# Patient Record
Sex: Male | Born: 1940 | Race: Black or African American | Hispanic: No | Marital: Married | Smoking: Never smoker
Health system: Southern US, Community
[De-identification: ages and names within clinical notes are randomized; demographics above are authoritative.]

## PROBLEM LIST (undated history)

## (undated) DIAGNOSIS — C801 Malignant (primary) neoplasm, unspecified: Secondary | ICD-10-CM

## (undated) DIAGNOSIS — S6990XA Unspecified injury of unspecified wrist, hand and finger(s), initial encounter: Secondary | ICD-10-CM

## (undated) DIAGNOSIS — F419 Anxiety disorder, unspecified: Secondary | ICD-10-CM

## (undated) DIAGNOSIS — K439 Ventral hernia without obstruction or gangrene: Secondary | ICD-10-CM

## (undated) DIAGNOSIS — K219 Gastro-esophageal reflux disease without esophagitis: Secondary | ICD-10-CM

## (undated) DIAGNOSIS — M1990 Unspecified osteoarthritis, unspecified site: Secondary | ICD-10-CM

## (undated) DIAGNOSIS — N400 Enlarged prostate without lower urinary tract symptoms: Secondary | ICD-10-CM

## (undated) DIAGNOSIS — I10 Essential (primary) hypertension: Secondary | ICD-10-CM

## (undated) HISTORY — PX: ROTATOR CUFF REPAIR: SHX139

## (undated) HISTORY — PX: APPENDECTOMY: SHX54

---

## 1978-05-07 HISTORY — PX: HERNIA REPAIR: SHX51

## 2005-10-16 ENCOUNTER — Encounter: Admission: RE | Admit: 2005-10-16 | Discharge: 2005-10-16 | Payer: Self-pay | Admitting: Family Medicine

## 2005-11-06 ENCOUNTER — Encounter: Admission: RE | Admit: 2005-11-06 | Discharge: 2006-02-04 | Payer: Self-pay | Admitting: Family Medicine

## 2007-05-30 ENCOUNTER — Emergency Department (HOSPITAL_COMMUNITY): Admission: EM | Admit: 2007-05-30 | Discharge: 2007-05-31 | Payer: Self-pay | Admitting: Emergency Medicine

## 2011-01-26 LAB — CBC
HCT: 35.1 — ABNORMAL LOW
Hemoglobin: 12.3 — ABNORMAL LOW
MCHC: 35
MCV: 84.9
Platelets: 219
RBC: 4.14 — ABNORMAL LOW
RDW: 13.8
WBC: 5

## 2011-01-26 LAB — BASIC METABOLIC PANEL
BUN: 20
CO2: 27
Calcium: 9
Chloride: 104
Creatinine, Ser: 1.02
GFR calc Af Amer: >60
GFR calc non Af Amer: >60
Glucose, Bld: 133 — ABNORMAL HIGH
Potassium: 4
Sodium: 137

## 2011-01-26 LAB — DIFFERENTIAL
Basophils Absolute: 0
Basophils Relative: 0
Eosinophils Absolute: 0.1
Eosinophils Relative: 2
Lymphocytes Relative: 38
Lymphs Abs: 1.9
Monocytes Absolute: 0.3
Monocytes Relative: 6
Neutro Abs: 2.7
Neutrophils Relative %: 54

## 2011-01-26 LAB — URINALYSIS, ROUTINE W REFLEX MICROSCOPIC
Glucose, UA: NEGATIVE
Ketones, ur: 15 — AB
Nitrite: POSITIVE — AB
Protein, ur: 100 — AB
Specific Gravity, Urine: 1.034 — ABNORMAL HIGH
Urobilinogen, UA: 1
pH: 6.5

## 2011-01-26 LAB — URINE MICROSCOPIC-ADD ON

## 2011-01-26 LAB — URINE CULTURE: Colony Count: 1000

## 2011-03-19 ENCOUNTER — Encounter: Payer: Self-pay | Admitting: *Deleted

## 2011-03-19 ENCOUNTER — Emergency Department (HOSPITAL_COMMUNITY): Payer: Medicare Other

## 2011-03-19 ENCOUNTER — Emergency Department (HOSPITAL_COMMUNITY)
Admission: EM | Admit: 2011-03-19 | Discharge: 2011-03-19 | Disposition: A | Discharge status: Home / Self Care | Payer: Medicare Other | Attending: Emergency Medicine | Admitting: Emergency Medicine

## 2011-03-19 DIAGNOSIS — IMO0002 Reserved for concepts with insufficient information to code with codable children: Secondary | ICD-10-CM | POA: Insufficient documentation

## 2011-03-19 DIAGNOSIS — I10 Essential (primary) hypertension: Secondary | ICD-10-CM | POA: Insufficient documentation

## 2011-03-19 DIAGNOSIS — W010XXA Fall on same level from slipping, tripping and stumbling without subsequent striking against object, initial encounter: Secondary | ICD-10-CM | POA: Insufficient documentation

## 2011-03-19 DIAGNOSIS — S8390XA Sprain of unspecified site of unspecified knee, initial encounter: Secondary | ICD-10-CM

## 2011-03-19 DIAGNOSIS — M79609 Pain in unspecified limb: Secondary | ICD-10-CM | POA: Insufficient documentation

## 2011-03-19 DIAGNOSIS — E119 Type 2 diabetes mellitus without complications: Secondary | ICD-10-CM | POA: Insufficient documentation

## 2011-03-19 DIAGNOSIS — Z79899 Other long term (current) drug therapy: Secondary | ICD-10-CM | POA: Insufficient documentation

## 2011-03-19 DIAGNOSIS — M7989 Other specified soft tissue disorders: Secondary | ICD-10-CM | POA: Insufficient documentation

## 2011-03-19 DIAGNOSIS — M25569 Pain in unspecified knee: Secondary | ICD-10-CM | POA: Insufficient documentation

## 2011-03-19 HISTORY — DX: Anxiety disorder, unspecified: F41.9

## 2011-03-19 HISTORY — DX: Benign prostatic hyperplasia without lower urinary tract symptoms: N40.0

## 2011-03-19 HISTORY — DX: Essential (primary) hypertension: I10

## 2011-03-19 MED ORDER — HYDROCODONE-ACETAMINOPHEN 5-500 MG PO TABS: 1.0000 | ORAL_TABLET | Freq: Four times a day (QID) | ORAL | Status: AC | PRN

## 2011-03-19 MED ORDER — HYDROCODONE-ACETAMINOPHEN 5-325 MG PO TABS: 1.0000 | ORAL_TABLET | Freq: Once | ORAL | Status: DC

## 2011-03-19 NOTE — ED Notes (Signed)
Pt slipped and fell around 2230, sliped in dog "poop",  c/o L thigh pain & knee pain, pinpoints most pain to mid thigh, describes as severe.

## 2011-03-19 NOTE — ED Notes (Signed)
Pt states that he tripped over his dogs and did a "split" pt feels like he hyper extended his knee. Pt CNS intact

## 2011-03-19 NOTE — ED Provider Notes (Signed)
History     CSN: [id number] Arrival date & time: 03/19/2011  1:32 AM   First MD Initiated Contact with Patient 03/19/11 0340      Chief Complaint  Patient presents with  . Leg Pain    after slipping and falling    (Consider location/radiation/quality/duration/timing/severity/associated sxs/prior treatment) Patient is a 70 y.o. male presenting with leg pain. The history is provided by the patient.  Leg Pain  The incident occurred 3 to 5 hours ago. The incident occurred at home. Injury mechanism: Twisting injury when going to feed his dog slipped  and injured his left distal thigh and knee. States he heard a pop able to walk on this and drive himself to the emergency department. Pain location: Left thigh and knee. The pain is moderate. The pain has been constant since onset. Pertinent negatives include no numbness, no inability to bear weight, no loss of motion, no muscle weakness, no loss of sensation and no tingling. The symptoms are aggravated by activity, bearing weight and palpation. He has tried nothing for the symptoms.   Pain is located left distal thigh and just above his patella with associated swelling. Sharp in nature. Moderate in severity. Constant since onset. No alleviating factors. No radiation of pain. No fall or injury otherwise.   Past Medical History  Diagnosis Date  . Hypertension   . Diabetes mellitus   . Anxiety   . Enlarged prostate     History reviewed. No pertinent past surgical history.  Family History  Problem Relation Age of Onset  . Diabetes Father     History  Substance Use Topics  . Smoking status: Never Smoker   . Smokeless tobacco: Not on file  . Alcohol Use: No      Review of Systems  Constitutional: Negative for fever and chills.  HENT: Negative for neck pain and neck stiffness.   Eyes: Negative for pain.  Respiratory: Negative for shortness of breath.   Cardiovascular: Positive for leg swelling. Negative for chest pain and  palpitations.  Gastrointestinal: Negative for abdominal pain.  Genitourinary: Negative for dysuria.  Musculoskeletal: Negative for back pain.  Skin: Negative for rash and wound.  Neurological: Negative for tingling, numbness and headaches.  All other systems reviewed and are negative.    Allergies  Review of patient's allergies indicates no known allergies.  Home Medications   Current Outpatient Rx  Name Route Sig Dispense Refill  . CITALOPRAM HYDROBROMIDE 20 MG PO TABS Oral Take 20 mg by mouth daily.      Marland Kitchen CLONAZEPAM PO Oral Take 1 mg by mouth 2 (two) times daily.      . IRON PO Oral Take 1 tablet by mouth daily.      Marland Kitchen LISINOPRIL-HYDROCHLOROTHIAZIDE 20-25 MG PO TABS Oral Take 1 tablet by mouth daily.      Marland Kitchen METFORMIN HCL ER 500 MG PO TB24 Oral Take 500 mg by mouth daily with breakfast.      . MULTIVITAMIN PO Oral Take 1 tablet by mouth daily.      Marland Kitchen TAMSULOSIN HCL 0.4 MG PO CAPS Oral Take 0.4 mg by mouth daily.        BP 137/79  Pulse 73  Temp(Src) 98.2 F (36.8 C) (Oral)  Resp 18  SpO2 96%  Physical Exam  Constitutional: He is oriented to person, place, and time. He appears well-developed and well-nourished.  HENT:  Head: Normocephalic and atraumatic.  Eyes: Conjunctivae and EOM are normal. Pupils are equal, round, and reactive  to light.  Neck: Full passive range of motion without pain. Neck supple. No thyromegaly present.       No midline cervical, thoracic or lumbar tenderness or warmth.  Cardiovascular: Normal rate, regular rhythm, S1 normal, S2 normal and intact distal pulses.   Pulmonary/Chest: Effort normal and breath sounds normal.  Abdominal: Soft. Bowel sounds are normal. There is no tenderness. There is no CVA tenderness.  Musculoskeletal: Normal range of motion.       Left lower extremity: Pain localized to distal medial aspect of thigh with overlying tenderness to palpation. There is moderate amount of swelling to this area as well as the suprapatellar  region. Patient is able to lift his leg off the bed and extend at the knee. No bony deformity. Distal neurovascular intact. Nontender over the hip and ankle. No appreciable joint instability  Neurological: He is alert and oriented to person, place, and time. He has normal strength and normal reflexes. No cranial nerve deficit or sensory deficit. He displays a negative Romberg sign. GCS eye subscore is 4. GCS verbal subscore is 5. GCS motor subscore is 6.       No lower extremity deficits with distal pulses, motor and sensorium to light touch intact.  Skin: Skin is warm and dry. No rash noted. No cyanosis. Nails show no clubbing.  Psychiatric: He has a normal mood and affect. His speech is normal and behavior is normal.    ED Course  Procedures (including critical care time)  Labs Reviewed - No data to display Dg Knee Complete 4 Views Left Show As Much Of Femur As Possible  03/19/2011  *RADIOLOGY REPORT*  Clinical Data: Status post fall; anterior and medial left knee pain.  LEFT KNEE - COMPLETE 4+ VIEW  Comparison: None.  Findings: There is no evidence of fracture or dislocation.  The joint spaces are preserved.  No significant degenerative change is seen; the patellofemoral joint is grossly unremarkable in appearance.  An enthesophyte is noted arising at the superior pole of the patella.  A small to moderate knee joint effusion is suspected.  There is significant soft tissue swelling in the suprapatellar region, raising question for soft tissue injury.  IMPRESSION:  1.  No evidence of fracture or dislocation. 2.  Small to moderate knee joint effusion suspected. 3.  Significant soft tissue swelling in the suprapatellar region, raising question for soft tissue injury.  Original Report Authenticated By: Tonia Ghent, M.D.      no narcotics provided emergency department patient is driving home .x-ray obtained and reviewed as above. Immobilizer and crutches provided. prescription for pain medications  provided    MDM   Injury mechanism with hearing a pop and now swelling raises concern for partial tear the quadriceps tendon, no complete tear by physical exam. Plan pain control, ice and elevation and close followup with orthopedics. Patient is a reliable historian in stable for discharge home.        Sunnie Nielsen, MD 03/19/11 [phone]

## 2011-03-19 NOTE — ED Notes (Signed)
Ortho paged for crutches and immobilizer 

## 2011-04-25 ENCOUNTER — Other Ambulatory Visit: Payer: Self-pay | Admitting: Family Medicine

## 2011-04-25 ENCOUNTER — Ambulatory Visit
Admission: RE | Admit: 2011-04-25 | Discharge: 2011-04-25 | Disposition: A | Discharge status: Home / Self Care | Payer: Medicare Other | Source: Ambulatory Visit | Attending: Family Medicine | Admitting: Family Medicine

## 2011-04-25 DIAGNOSIS — R52 Pain, unspecified: Secondary | ICD-10-CM

## 2011-04-25 DIAGNOSIS — R609 Edema, unspecified: Secondary | ICD-10-CM

## 2013-02-12 ENCOUNTER — Other Ambulatory Visit (HOSPITAL_COMMUNITY): Payer: Self-pay | Admitting: Sports Medicine

## 2013-02-12 DIAGNOSIS — M79652 Pain in left thigh: Secondary | ICD-10-CM

## 2013-02-20 ENCOUNTER — Encounter (HOSPITAL_COMMUNITY): Payer: Medicare Other

## 2013-03-23 ENCOUNTER — Other Ambulatory Visit: Payer: Self-pay | Admitting: Orthopedic Surgery

## 2013-03-23 DIAGNOSIS — M545 Low back pain, unspecified: Secondary | ICD-10-CM

## 2013-03-23 DIAGNOSIS — M79652 Pain in left thigh: Secondary | ICD-10-CM

## 2013-03-23 DIAGNOSIS — R1900 Intra-abdominal and pelvic swelling, mass and lump, unspecified site: Secondary | ICD-10-CM

## 2013-03-25 ENCOUNTER — Inpatient Hospital Stay
Admission: RE | Admit: 2013-03-25 | Discharge: 2013-03-25 | Disposition: A | Discharge status: Home / Self Care | Payer: Self-pay | Source: Ambulatory Visit | Attending: Orthopedic Surgery | Admitting: Orthopedic Surgery

## 2013-03-25 ENCOUNTER — Ambulatory Visit
Admission: RE | Admit: 2013-03-25 | Discharge: 2013-03-25 | Disposition: A | Discharge status: Home / Self Care | Payer: Medicare Other | Source: Ambulatory Visit | Attending: Orthopedic Surgery | Admitting: Orthopedic Surgery

## 2013-03-25 ENCOUNTER — Other Ambulatory Visit: Payer: Self-pay | Admitting: Orthopedic Surgery

## 2013-03-25 DIAGNOSIS — R52 Pain, unspecified: Secondary | ICD-10-CM

## 2013-03-25 DIAGNOSIS — M545 Low back pain, unspecified: Secondary | ICD-10-CM

## 2013-03-25 DIAGNOSIS — M79652 Pain in left thigh: Secondary | ICD-10-CM

## 2013-03-25 DIAGNOSIS — R1900 Intra-abdominal and pelvic swelling, mass and lump, unspecified site: Secondary | ICD-10-CM

## 2013-03-25 MED ORDER — GADOBENATE DIMEGLUMINE 529 MG/ML IV SOLN
18.0000 mL | Freq: Once | INTRAVENOUS | Status: AC | PRN
  Administered 2013-03-25: 18 mL via INTRAVENOUS

## 2013-03-26 ENCOUNTER — Inpatient Hospital Stay
Admission: RE | Admit: 2013-03-26 | Discharge: 2013-03-26 | Disposition: A | Discharge status: Home / Self Care | Payer: Medicare Other | Source: Ambulatory Visit | Attending: Orthopedic Surgery | Admitting: Orthopedic Surgery

## 2013-03-26 ENCOUNTER — Inpatient Hospital Stay: Admission: RE | Admit: 2013-03-26 | Payer: Medicare Other | Source: Ambulatory Visit

## 2013-03-29 ENCOUNTER — Other Ambulatory Visit: Payer: Medicare Other

## 2013-04-20 ENCOUNTER — Encounter (HOSPITAL_COMMUNITY): Payer: Self-pay | Admitting: Pharmacy Technician

## 2013-04-20 NOTE — Patient Instructions (Addendum)
20 JAXSTON CHOHAN  04/20/2013   Your procedure is scheduled on:  04/24/13  FRIDAY  Report to [facility] at   1000    AM.  Call this number if you have problems the morning of surgery: [phone]       Remember:   Do not eat food After Midnight. Thursday NIGHT--- MAY HAVE CLEAR LIQUIDS Friday MORNING UNTIL 0630am- THEN NOTHING BY MOUTH   Take these medicines the morning of surgery with A SIP OF WATER: Clonazepam, Proscar DO NOT TAKE ANY DIABETES MEDICINE MORNING OF SURGERY  .  Contacts, dentures or partial plates can not be worn to surgery  Leave suitcase in the car. After surgery it may be brought to your room.  For patients admitted to the hospital, checkout time is 11:00 AM day of  discharge.             SPECIAL INSTRUCTIONS- SEE [facility] PREPARING FOR SURGERY INSTRUCTION SHEET-     DO NOT WEAR JEWELRY, LOTIONS, POWDERS, OR PERFUMES.  WOMEN-- DO NOT SHAVE LEGS OR UNDERARMS FOR 12 HOURS BEFORE SHOWERS. MEN MAY SHAVE FACE.  Patients discharged the day of surgery will not be allowed to drive home. IF going home the day of surgery, you must have a driver and someone to stay with you for the first 24 hours  Name and phone number of your driver:  Wife  Wynona Canes                                                                      Please read over the following fact sheets that you were given: MRSA Information, Incentive Spirometry Sheet  Arron Tetrault  PST 336  [id number]                 FAILURE TO FOLLOW THESE INSTRUCTIONS MAY RESULT IN  CANCELLATION   OF YOUR SURGERY                                                  Patient Signature _____________________________

## 2013-04-20 NOTE — Progress Notes (Signed)
Dr Darrelyn Hillock-- NEED PRE OP ORDERS PLEASE-- HAS PST APPT 04/21/13 AM Thanks

## 2013-04-21 ENCOUNTER — Encounter (HOSPITAL_COMMUNITY)
Admission: RE | Admit: 2013-04-21 | Discharge: 2013-04-21 | Disposition: A | Discharge status: Home / Self Care | Payer: Medicare Other | Source: Ambulatory Visit | Attending: Orthopedic Surgery | Admitting: Orthopedic Surgery

## 2013-04-21 ENCOUNTER — Ambulatory Visit (HOSPITAL_COMMUNITY)
Admission: RE | Admit: 2013-04-21 | Discharge: 2013-04-21 | Disposition: A | Discharge status: Home / Self Care | Payer: Medicare Other | Source: Ambulatory Visit | Attending: Surgical | Admitting: Surgical

## 2013-04-21 ENCOUNTER — Encounter (HOSPITAL_COMMUNITY): Payer: Self-pay

## 2013-04-21 DIAGNOSIS — J986 Disorders of diaphragm: Secondary | ICD-10-CM | POA: Diagnosis not present

## 2013-04-21 DIAGNOSIS — M51379 Other intervertebral disc degeneration, lumbosacral region without mention of lumbar back pain or lower extremity pain: Secondary | ICD-10-CM | POA: Insufficient documentation

## 2013-04-21 DIAGNOSIS — M47817 Spondylosis without myelopathy or radiculopathy, lumbosacral region: Secondary | ICD-10-CM | POA: Diagnosis not present

## 2013-04-21 DIAGNOSIS — M47814 Spondylosis without myelopathy or radiculopathy, thoracic region: Secondary | ICD-10-CM | POA: Diagnosis not present

## 2013-04-21 DIAGNOSIS — Z01810 Encounter for preprocedural cardiovascular examination: Secondary | ICD-10-CM | POA: Insufficient documentation

## 2013-04-21 DIAGNOSIS — I10 Essential (primary) hypertension: Secondary | ICD-10-CM | POA: Diagnosis not present

## 2013-04-21 DIAGNOSIS — Z01812 Encounter for preprocedural laboratory examination: Secondary | ICD-10-CM | POA: Diagnosis not present

## 2013-04-21 DIAGNOSIS — M5137 Other intervertebral disc degeneration, lumbosacral region: Secondary | ICD-10-CM | POA: Insufficient documentation

## 2013-04-21 DIAGNOSIS — Z01818 Encounter for other preprocedural examination: Secondary | ICD-10-CM | POA: Insufficient documentation

## 2013-04-21 LAB — COMPREHENSIVE METABOLIC PANEL
ALT: 26 U/L (ref 0–53)
AST: 29 U/L (ref 0–37)
Albumin: 4.1 g/dL (ref 3.5–5.2)
Alkaline Phosphatase: 71 U/L (ref 39–117)
BUN: 12 mg/dL (ref 6–23)
CO2: 27 mEq/L (ref 19–32)
Calcium: 10 mg/dL (ref 8.4–10.5)
Chloride: 98 mEq/L (ref 96–112)
Creatinine, Ser: 0.76 mg/dL (ref 0.50–1.35)
GFR calc Af Amer: >90 mL/min (ref >90)
GFR calc non Af Amer: 89 mL/min — ABNORMAL LOW (ref >90)
Glucose, Bld: 131 mg/dL — ABNORMAL HIGH (ref 70–99)
Potassium: 4.2 mEq/L (ref 3.5–5.1)
Sodium: 136 mEq/L (ref 135–145)
Total Bilirubin: 0.2 mg/dL — ABNORMAL LOW (ref 0.3–1.2)
Total Protein: 7.7 g/dL (ref 6.0–8.3)

## 2013-04-21 LAB — CBC
HCT: 38 % — ABNORMAL LOW (ref 39.0–52.0)
Hemoglobin: 12.9 g/dL — ABNORMAL LOW (ref 13.0–17.0)
MCH: 28.9 pg (ref 26.0–34.0)
MCHC: 33.9 g/dL (ref 30.0–36.0)
MCV: 85.2 fL (ref 78.0–100.0)
Platelets: 262 10*3/uL (ref 150–400)
RBC: 4.46 MIL/uL (ref 4.22–5.81)
RDW: 15.3 % (ref 11.5–15.5)
WBC: 6.9 10*3/uL (ref 4.0–10.5)

## 2013-04-21 LAB — PROTIME-INR
INR: 1.02 (ref 0.00–1.49)
Prothrombin Time: 13.2 seconds (ref 11.6–15.2)

## 2013-04-21 LAB — URINALYSIS, ROUTINE W REFLEX MICROSCOPIC
Bilirubin Urine: NEGATIVE
Glucose, UA: 250 mg/dL — AB
Hgb urine dipstick: NEGATIVE
Ketones, ur: NEGATIVE mg/dL
Leukocytes, UA: NEGATIVE
Nitrite: NEGATIVE
Protein, ur: NEGATIVE mg/dL
Specific Gravity, Urine: 1.015 (ref 1.005–1.030)
Urobilinogen, UA: 0.2 mg/dL (ref 0.0–1.0)
pH: 5.5 (ref 5.0–8.0)

## 2013-04-21 LAB — SURGICAL PCR SCREEN
MRSA, PCR: NEGATIVE
Staphylococcus aureus: NEGATIVE

## 2013-04-21 LAB — APTT: aPTT: 27 seconds (ref 24–37)

## 2013-04-21 NOTE — Progress Notes (Signed)
Faxed urinalysis to Dr Darrelyn Hillock through [facility]

## 2013-04-21 NOTE — H&P (Signed)
Peter Summers is an 72 y.o. male.   Chief Complaint: low back pain HPI: Peter Summers presented to Dr. Markus Jarvis office with the chief complaint of low back pain. Symptoms reported today include: pain, aching, throbbing, stiffness, pain with weightbearing, difficulty ambulating, difficulty arising from chair, burning, leg pain, pain with lying, pain with lifting, pain with sitting and pain with standing. He has been dealing with these symptoms for about 7 months. Peter Summers presented to Dr. Darrelyn Hillock with severe pain in the left gluteal region radiating around the left thigh to the left knee. This has been present since July. He was seen by Dr. Penni Bombard and he had an MRI of his lumbar spine and an MRI of his left thigh. The left thigh MRI was not impressive. The low back, there were questionable issues at L3-4 on the left. Dr. Ethelene Hal did a selective nerve root block with little relief.  Past Medical History  Diagnosis Date  . Hypertension   . Diabetes mellitus   . Anxiety   . Enlarged prostate     Past Surgical History  Procedure Laterality Date  . Hernia repair  1980    hiatal hernia  . Appendectomy    . Rotator cuff repair Bilateral     Family History  Problem Relation Age of Onset  . Diabetes Father    Social History:  reports that he has never smoked. He has never used smokeless tobacco. He reports that he uses illicit drugs. He reports that he does not drink alcohol.  Allergies: No Known Allergies  Current outpatient prescriptions: clonazePAM (KLONOPIN) 1 MG tablet, Take 1 mg by mouth 2 (two) times daily., Disp: , Rfl: ;   ferrous fumarate (HEMOCYTE - 106 MG FE) 325 (106 FE) MG TABS tablet, Take 1 tablet by mouth daily., Disp: , Rfl: ;   finasteride (PROSCAR) 5 MG tablet, Take 5 mg by mouth daily., Disp: , Rfl: ;   lisinopril-hydrochlorothiazide (PRINZIDE,ZESTORETIC) 20-12.5 MG per tablet, Take 1 tablet by mouth every morning., Disp: , Rfl:  metFORMIN (GLUCOPHAGE-XR) 500 MG 24 hr  tablet, Take 500 mg by mouth daily with breakfast. , Disp: , Rfl: ;   Multiple Vitamin (MULTIVITAMIN WITH MINERALS) TABS tablet, Take 1 tablet by mouth daily., Disp: , Rfl:   Results for orders placed during the hospital encounter of 04/21/13 (from the past 48 hour(s))  URINALYSIS, ROUTINE W REFLEX MICROSCOPIC     Status: Abnormal   Collection Time    04/21/13  9:14 AM      Result Value Range   Color, Urine YELLOW  YELLOW   APPearance CLEAR  CLEAR   Specific Gravity, Urine 1.015  1.005 - 1.030   pH 5.5  5.0 - 8.0   Glucose, UA 250 (*) NEGATIVE mg/dL   Hgb urine dipstick NEGATIVE  NEGATIVE   Bilirubin Urine NEGATIVE  NEGATIVE   Ketones, ur NEGATIVE  NEGATIVE mg/dL   Protein, ur NEGATIVE  NEGATIVE mg/dL   Urobilinogen, UA 0.2  0.0 - 1.0 mg/dL   Nitrite NEGATIVE  NEGATIVE   Leukocytes, UA NEGATIVE  NEGATIVE   Comment: MICROSCOPIC NOT DONE ON URINES WITH NEGATIVE PROTEIN, BLOOD, LEUKOCYTES, NITRITE, OR GLUCOSE <1000 mg/dL.  SURGICAL PCR SCREEN     Status: None   Collection Time    04/21/13  9:15 AM      Result Value Range   MRSA, PCR NEGATIVE  NEGATIVE   Staphylococcus aureus NEGATIVE  NEGATIVE   Comment:  The Xpert SA Assay (FDA     approved for NASAL specimens     in patients over 74 years of age),     is one component of     a comprehensive surveillance     program.  Test performance has     been validated by [employer] for patients greater     than or equal to 27 year old.     It is not intended     to diagnose infection nor to     guide or monitor treatment.  APTT     Status: None   Collection Time    04/21/13 10:10 AM      Result Value Range   aPTT 27  24 - 37 seconds  COMPREHENSIVE METABOLIC PANEL     Status: Abnormal   Collection Time    04/21/13 10:10 AM      Result Value Range   Sodium 136  135 - 145 mEq/L   Potassium 4.2  3.5 - 5.1 mEq/L   Comment: SLIGHT HEMOLYSIS     HEMOLYSIS AT THIS LEVEL MAY AFFECT RESULT   Chloride 98  96 - 112 mEq/L    CO2 27  19 - 32 mEq/L   Glucose, Bld 131 (*) 70 - 99 mg/dL   BUN 12  6 - 23 mg/dL   Creatinine, Ser 1.61  0.50 - 1.35 mg/dL   Calcium 09.6  8.4 - 04.5 mg/dL   Total Protein 7.7  6.0 - 8.3 g/dL   Albumin 4.1  3.5 - 5.2 g/dL   AST 29  0 - 37 U/L   Comment: SLIGHT HEMOLYSIS     HEMOLYSIS AT THIS LEVEL MAY AFFECT RESULT   ALT 26  0 - 53 U/L   Comment: SLIGHT HEMOLYSIS     HEMOLYSIS AT THIS LEVEL MAY AFFECT RESULT   Alkaline Phosphatase 71  39 - 117 U/L   Total Bilirubin 0.2 (*) 0.3 - 1.2 mg/dL   GFR calc non Af Amer 89 (*) >90 mL/min   GFR calc Af Amer >90  >90 mL/min   Comment: (NOTE)     The eGFR has been calculated using the CKD EPI equation.     This calculation has not been validated in all clinical situations.     eGFR's persistently <90 mL/min signify possible Chronic Kidney     Disease.  PROTIME-INR     Status: None   Collection Time    04/21/13 10:10 AM      Result Value Range   Prothrombin Time 13.2  11.6 - 15.2 seconds   INR 1.02  0.00 - 1.49  CBC     Status: Abnormal   Collection Time    04/21/13 10:10 AM      Result Value Range   WBC 6.9  4.0 - 10.5 K/uL   RBC 4.46  4.22 - 5.81 MIL/uL   Hemoglobin 12.9 (*) 13.0 - 17.0 g/dL   HCT 40.9 (*) 81.1 - 91.4 %   MCV 85.2  78.0 - 100.0 fL   MCH 28.9  26.0 - 34.0 pg   MCHC 33.9  30.0 - 36.0 g/dL   RDW 78.2  95.6 - 21.3 %   Platelets 262  150 - 400 K/uL   Dg Chest 2 View  04/21/2013   CLINICAL DATA:  Hypertension.  EXAM: CHEST  2 VIEW  COMPARISON:  None.  FINDINGS: Mediastinum and hilar structures are normal. Mild subsegmental atelectasis left  lung base noted. There is mild elevation left hemidiaphragm. Lungs are otherwise clear. No pleural effusion or pneumothorax. The heart size is normal. Degenerative changes thoracic spine and both shoulders.  IMPRESSION: Mild elevation left hemidiaphragm, otherwise negative chest.   Electronically Signed   By: Maisie Fus  Register   On: 04/21/2013 11:57   Dg Lumbar Spine 2-3  Views  04/21/2013   CLINICAL DATA:  Preop lumbar surgery. Surgeon requests spine labeling.  EXAM: LUMBAR SPINE - 2-3 VIEW  COMPARISON:  Outside MRI 03/02/2013.  FINDINGS: 5 lumbar type vertebral bodies are assumed. There is disc space narrowing at L4-5 most notably. 3 mm of facet mediated anterolisthesis is present at L3-L4 with slight disc space narrowing. No worrisome osseous lesions.  IMPRESSION: Lumbar spondylosis as described.   Electronically Signed   By: Davonna Belling M.D.   On: 04/21/2013 11:35    Review of Systems  Constitutional: Negative.   HENT: Negative.   Eyes: Negative.   Respiratory: Negative.   Cardiovascular: Negative.   Gastrointestinal: Negative.   Genitourinary: Negative.   Musculoskeletal: Positive for back pain. Negative for falls, joint pain, myalgias and neck pain.  Skin: Negative.   Neurological: Positive for tingling. Negative for dizziness, tremors, sensory change, speech change, focal weakness, seizures and loss of consciousness.  Psychiatric/Behavioral: Negative.     Physical Exam  Constitutional: He is oriented to person, place, and time. He appears well-developed and well-nourished. No distress.  HENT:  Head: Normocephalic and atraumatic.  Right Ear: External ear normal.  Left Ear: External ear normal.  Nose: Nose normal.  Mouth/Throat: Oropharynx is clear and moist.  Eyes: Conjunctivae and EOM are normal.  Neck: Normal range of motion. Neck supple.  Cardiovascular: Normal rate, regular rhythm, normal heart sounds and intact distal pulses.   No murmur heard. Respiratory: Effort normal and breath sounds normal. No respiratory distress. He has no wheezes.  GI: Soft. Bowel sounds are normal. He exhibits no distension. There is no tenderness.  Musculoskeletal:       Right hip: Normal.       Left hip: Normal.       Right knee: Normal.       Left knee: Normal.       Lumbar back: He exhibits tenderness, pain and spasm.       Right lower leg: He exhibits  no tenderness and no swelling.       Left lower leg: He exhibits no tenderness and no swelling.  He is walking with an antalgic gait using one crutch. Examination of the lumbar spine shows a little tenderness in the lower lumbar segments. No true SI joint based pain. Passively her hip ROM is mildly uncomfortable but it is more buttock than anything else. Mildly positive SLR on the left for increasing back and lateral thigh pain.  Neurological: He is alert and oriented to person, place, and time. He has normal strength and normal reflexes. No sensory deficit.  Skin: No rash noted. He is not diaphoretic. No erythema.  Psychiatric: He has a normal mood and affect. His behavior is normal.     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 We do use antibiotics prior to the surgery and during surgery and after surgery. Number two is always a slight degree of probability that you could develop a blood  clot in your leg after any type of surgery and we try our best to prevent that with aspirin Summers op when it is safe to begin. The third is a dural leak. That is the spinal fluid leak that could occur. At certain rare times the bone or the disc could literally stick to the dura which is the lining which contains the spinal fluid and we could develop a small tear in that lining which we then patch up. That is an extremely rare complication. The last and final complication is a recurrent disc rupture. That means that you could rupture another small piece of disc later on down the road and there is about a 2% chance of that.  Madison Albea LAUREN 04/21/2013, 3:36 PM

## 2013-04-21 NOTE — Progress Notes (Signed)
04/21/13 0939  OBSTRUCTIVE SLEEP APNEA  Have you ever been diagnosed with sleep apnea through a sleep study? No  Do you snore loudly (loud enough to be heard through closed doors)?  1  Do you often feel tired, fatigued, or sleepy during the daytime? 0  Has anyone observed you stop breathing during your sleep? 0  Do you have, or are you being treated for high blood pressure? 1  BMI more than 35 kg/m2? 0  Age over 72 years old? 1  Neck circumference greater than 40 cm/18 inches? 0  Gender: 1  Obstructive Sleep Apnea Score 4  Score 4 or greater  Results sent to PCP

## 2013-04-24 ENCOUNTER — Observation Stay (HOSPITAL_COMMUNITY)
Admission: RE | Admit: 2013-04-24 | Discharge: 2013-04-26 | Disposition: A | Discharge status: Home / Self Care | Payer: Medicare Other | Source: Ambulatory Visit | Attending: Orthopedic Surgery | Admitting: Orthopedic Surgery

## 2013-04-24 ENCOUNTER — Encounter (HOSPITAL_COMMUNITY): Payer: Self-pay | Admitting: *Deleted

## 2013-04-24 ENCOUNTER — Ambulatory Visit (HOSPITAL_COMMUNITY): Payer: Medicare Other

## 2013-04-24 ENCOUNTER — Encounter (HOSPITAL_COMMUNITY)
Admission: RE | Disposition: A | Discharge status: Home / Self Care | Payer: Self-pay | Source: Ambulatory Visit | Attending: Orthopedic Surgery

## 2013-04-24 ENCOUNTER — Ambulatory Visit (HOSPITAL_COMMUNITY): Payer: Medicare Other | Admitting: Anesthesiology

## 2013-04-24 ENCOUNTER — Encounter (HOSPITAL_COMMUNITY): Payer: Medicare Other | Admitting: Anesthesiology

## 2013-04-24 DIAGNOSIS — M48061 Spinal stenosis, lumbar region without neurogenic claudication: Principal | ICD-10-CM | POA: Insufficient documentation

## 2013-04-24 DIAGNOSIS — M7130 Other bursal cyst, unspecified site: Secondary | ICD-10-CM | POA: Insufficient documentation

## 2013-04-24 DIAGNOSIS — M7138 Other bursal cyst, other site: Secondary | ICD-10-CM

## 2013-04-24 DIAGNOSIS — M48062 Spinal stenosis, lumbar region with neurogenic claudication: Secondary | ICD-10-CM

## 2013-04-24 DIAGNOSIS — IMO0002 Reserved for concepts with insufficient information to code with codable children: Secondary | ICD-10-CM | POA: Insufficient documentation

## 2013-04-24 HISTORY — PX: LUMBAR LAMINECTOMY/DECOMPRESSION MICRODISCECTOMY: SHX5026

## 2013-04-24 LAB — GLUCOSE, CAPILLARY
Glucose-Capillary: 118 mg/dL — ABNORMAL HIGH (ref 70–99)
Glucose-Capillary: 134 mg/dL — ABNORMAL HIGH (ref 70–99)
Glucose-Capillary: 126 mg/dL — ABNORMAL HIGH (ref 70–99)

## 2013-04-24 SURGERY — LUMBAR LAMINECTOMY/DECOMPRESSION MICRODISCECTOMY
Anesthesia: General

## 2013-04-24 MED ORDER — LACTATED RINGERS IV SOLN
INTRAVENOUS | Status: DC
  Administered 2013-04-24: 23:00:00 via INTRAVENOUS

## 2013-04-24 MED ORDER — ONDANSETRON HCL 4 MG/2ML IJ SOLN
INTRAMUSCULAR | Status: DC | PRN
  Administered 2013-04-24: 4 mg via INTRAVENOUS

## 2013-04-24 MED ORDER — LISINOPRIL 20 MG PO TABS
20.0000 mg | ORAL_TABLET | Freq: Every day | ORAL | Status: DC
  Administered 2013-04-25: 10:00:00 20 mg via ORAL
  Filled 2013-04-24 (×2): qty 1

## 2013-04-24 MED ORDER — ACETAMINOPHEN 325 MG PO TABS: 650.0000 mg | ORAL_TABLET | ORAL | Status: DC | PRN

## 2013-04-24 MED ORDER — PHENYLEPHRINE HCL 10 MG/ML IJ SOLN
20.0000 mg | INTRAVENOUS | Status: DC | PRN
  Administered 2013-04-24: 15 ug/min via INTRAVENOUS

## 2013-04-24 MED ORDER — SODIUM CHLORIDE 0.9 % IJ SOLN
INTRAMUSCULAR | Status: AC
  Filled 2013-04-24: qty 20

## 2013-04-24 MED ORDER — THROMBIN 5000 UNITS EX SOLR
CUTANEOUS | Status: DC | PRN
  Administered 2013-04-24: 10000 [IU] via TOPICAL

## 2013-04-24 MED ORDER — SUCCINYLCHOLINE CHLORIDE 20 MG/ML IJ SOLN
INTRAMUSCULAR | Status: AC
  Filled 2013-04-24: qty 1

## 2013-04-24 MED ORDER — BACITRACIN-NEOMYCIN-POLYMYXIN 400-5-5000 EX OINT
TOPICAL_OINTMENT | CUTANEOUS | Status: AC
  Filled 2013-04-24: qty 1

## 2013-04-24 MED ORDER — FENTANYL CITRATE 0.05 MG/ML IJ SOLN
INTRAMUSCULAR | Status: AC
  Filled 2013-04-24: qty 5

## 2013-04-24 MED ORDER — METHOCARBAMOL 500 MG PO TABS: 500.0000 mg | ORAL_TABLET | Freq: Four times a day (QID) | ORAL | Status: DC | PRN

## 2013-04-24 MED ORDER — FENTANYL CITRATE 0.05 MG/ML IJ SOLN
INTRAMUSCULAR | Status: DC | PRN
  Administered 2013-04-24: 50 ug via INTRAVENOUS
  Administered 2013-04-24: 100 ug via INTRAVENOUS
  Administered 2013-04-24 (×2): 50 ug via INTRAVENOUS

## 2013-04-24 MED ORDER — OXYCODONE HCL 5 MG/5ML PO SOLN: 5.0000 mg | Freq: Once | ORAL | Status: DC | PRN

## 2013-04-24 MED ORDER — INSULIN ASPART 100 UNIT/ML ~~LOC~~ SOLN
0.0000 [IU] | Freq: Three times a day (TID) | SUBCUTANEOUS | Status: DC
  Administered 2013-04-25 – 2013-04-26 (×3): 2 [IU] via SUBCUTANEOUS

## 2013-04-24 MED ORDER — GLYCOPYRROLATE 0.2 MG/ML IJ SOLN
INTRAMUSCULAR | Status: AC
  Filled 2013-04-24: qty 4

## 2013-04-24 MED ORDER — ONDANSETRON HCL 4 MG/2ML IJ SOLN
4.0000 mg | INTRAMUSCULAR | Status: DC | PRN
  Administered 2013-04-24: 4 mg via INTRAVENOUS
  Filled 2013-04-24: qty 2

## 2013-04-24 MED ORDER — LIDOCAINE HCL (CARDIAC) 20 MG/ML IV SOLN
INTRAVENOUS | Status: AC
  Filled 2013-04-24: qty 5

## 2013-04-24 MED ORDER — HYDROCHLOROTHIAZIDE 12.5 MG PO CAPS
12.5000 mg | ORAL_CAPSULE | Freq: Every day | ORAL | Status: DC
  Administered 2013-04-25 – 2013-04-26 (×2): 12.5 mg via ORAL
  Filled 2013-04-24 (×2): qty 1

## 2013-04-24 MED ORDER — ONDANSETRON HCL 4 MG/2ML IJ SOLN
INTRAMUSCULAR | Status: AC
  Filled 2013-04-24: qty 2

## 2013-04-24 MED ORDER — POLYETHYLENE GLYCOL 3350 17 G PO PACK
17.0000 g | PACK | Freq: Every day | ORAL | Status: DC | PRN
  Administered 2013-04-25 (×2): 17 g via ORAL

## 2013-04-24 MED ORDER — BUPIVACAINE-EPINEPHRINE PF 0.5-1:200000 % IJ SOLN
INTRAMUSCULAR | Status: AC
  Filled 2013-04-24: qty 30

## 2013-04-24 MED ORDER — SUCCINYLCHOLINE CHLORIDE 20 MG/ML IJ SOLN
INTRAMUSCULAR | Status: DC | PRN
  Administered 2013-04-24: 100 mg via INTRAVENOUS

## 2013-04-24 MED ORDER — BUPIVACAINE LIPOSOME 1.3 % IJ SUSP
20.0000 mL | Freq: Once | INTRAMUSCULAR | Status: AC
  Administered 2013-04-24: 20 mL
  Filled 2013-04-24: qty 20

## 2013-04-24 MED ORDER — MEPERIDINE HCL 50 MG/ML IJ SOLN: 6.2500 mg | INTRAMUSCULAR | Status: DC | PRN

## 2013-04-24 MED ORDER — EPHEDRINE SULFATE 50 MG/ML IJ SOLN
INTRAMUSCULAR | Status: DC | PRN
  Administered 2013-04-24 (×2): 5 mg via INTRAVENOUS

## 2013-04-24 MED ORDER — FLEET ENEMA 7-19 GM/118ML RE ENEM: 1.0000 | ENEMA | Freq: Once | RECTAL | Status: AC | PRN

## 2013-04-24 MED ORDER — PROPOFOL 10 MG/ML IV BOLUS
INTRAVENOUS | Status: AC
  Filled 2013-04-24: qty 20

## 2013-04-24 MED ORDER — LIDOCAINE HCL (CARDIAC) 20 MG/ML IV SOLN
INTRAVENOUS | Status: DC | PRN
  Administered 2013-04-24: 80 mg via INTRAVENOUS

## 2013-04-24 MED ORDER — PHENYLEPHRINE HCL 10 MG/ML IJ SOLN
INTRAMUSCULAR | Status: DC | PRN
  Administered 2013-04-24 (×4): 40 ug via INTRAVENOUS

## 2013-04-24 MED ORDER — OXYCODONE HCL 5 MG PO TABS: 5.0000 mg | ORAL_TABLET | Freq: Once | ORAL | Status: DC | PRN

## 2013-04-24 MED ORDER — PROPOFOL 10 MG/ML IV BOLUS
INTRAVENOUS | Status: DC | PRN
  Administered 2013-04-24: 180 mg via INTRAVENOUS

## 2013-04-24 MED ORDER — BISACODYL 10 MG RE SUPP: 10.0000 mg | Freq: Every day | RECTAL | Status: DC | PRN

## 2013-04-24 MED ORDER — CEFAZOLIN SODIUM 1-5 GM-% IV SOLN
1.0000 g | Freq: Three times a day (TID) | INTRAVENOUS | Status: AC
  Administered 2013-04-24 – 2013-04-25 (×3): 1 g via INTRAVENOUS
  Filled 2013-04-24 (×4): qty 50

## 2013-04-24 MED ORDER — ROCURONIUM BROMIDE 100 MG/10ML IV SOLN
INTRAVENOUS | Status: DC | PRN
  Administered 2013-04-24: 40 mg via INTRAVENOUS
  Administered 2013-04-24: 5 mg via INTRAVENOUS

## 2013-04-24 MED ORDER — PHENYLEPHRINE 40 MCG/ML (10ML) SYRINGE FOR IV PUSH (FOR BLOOD PRESSURE SUPPORT)
PREFILLED_SYRINGE | INTRAVENOUS | Status: AC
  Filled 2013-04-24: qty 10

## 2013-04-24 MED ORDER — OXYCODONE-ACETAMINOPHEN 5-325 MG PO TABS: 1.0000 | ORAL_TABLET | ORAL | Status: DC | PRN

## 2013-04-24 MED ORDER — CLONAZEPAM 1 MG PO TABS
1.0000 mg | ORAL_TABLET | Freq: Two times a day (BID) | ORAL | Status: DC
  Administered 2013-04-24 – 2013-04-26 (×4): 1 mg via ORAL
  Filled 2013-04-24 (×4): qty 1

## 2013-04-24 MED ORDER — GLYCOPYRROLATE 0.2 MG/ML IJ SOLN
INTRAMUSCULAR | Status: DC | PRN
  Administered 2013-04-24: .8 mg via INTRAVENOUS

## 2013-04-24 MED ORDER — NEOSTIGMINE METHYLSULFATE 1 MG/ML IJ SOLN
INTRAMUSCULAR | Status: AC
  Filled 2013-04-24: qty 10

## 2013-04-24 MED ORDER — METHOCARBAMOL 100 MG/ML IJ SOLN
500.0000 mg | Freq: Four times a day (QID) | INTRAVENOUS | Status: DC | PRN
  Administered 2013-04-24: 500 mg via INTRAVENOUS
  Filled 2013-04-24: qty 5

## 2013-04-24 MED ORDER — FINASTERIDE 5 MG PO TABS
5.0000 mg | ORAL_TABLET | Freq: Every day | ORAL | Status: DC
  Administered 2013-04-24 – 2013-04-26 (×3): 5 mg via ORAL
  Filled 2013-04-24 (×3): qty 1

## 2013-04-24 MED ORDER — FERROUS FUMARATE 325 (106 FE) MG PO TABS
1.0000 | ORAL_TABLET | Freq: Every day | ORAL | Status: DC
  Administered 2013-04-24 – 2013-04-26 (×3): 106 mg via ORAL
  Filled 2013-04-24 (×4): qty 1

## 2013-04-24 MED ORDER — BUPIVACAINE LIPOSOME 1.3 % IJ SUSP
INTRAMUSCULAR | Status: DC | PRN
  Administered 2013-04-24: 20 mL

## 2013-04-24 MED ORDER — ROCURONIUM BROMIDE 100 MG/10ML IV SOLN
INTRAVENOUS | Status: AC
  Filled 2013-04-24: qty 1

## 2013-04-24 MED ORDER — PROMETHAZINE HCL 25 MG/ML IJ SOLN: 6.2500 mg | INTRAMUSCULAR | Status: DC | PRN

## 2013-04-24 MED ORDER — LISINOPRIL-HYDROCHLOROTHIAZIDE 20-12.5 MG PO TABS: 1.0000 | ORAL_TABLET | Freq: Every morning | ORAL | Status: DC

## 2013-04-24 MED ORDER — METFORMIN HCL ER 500 MG PO TB24
500.0000 mg | ORAL_TABLET | Freq: Every day | ORAL | Status: DC
  Administered 2013-04-25 – 2013-04-26 (×2): 500 mg via ORAL
  Filled 2013-04-24 (×3): qty 1

## 2013-04-24 MED ORDER — HYDROMORPHONE HCL PF 1 MG/ML IJ SOLN
INTRAMUSCULAR | Status: AC
  Administered 2013-04-24: 1 mg via INTRAVENOUS
  Filled 2013-04-24: qty 1

## 2013-04-24 MED ORDER — THROMBIN 5000 UNITS EX SOLR
CUTANEOUS | Status: AC
  Filled 2013-04-24: qty 10000

## 2013-04-24 MED ORDER — CEFAZOLIN SODIUM-DEXTROSE 2-3 GM-% IV SOLR
INTRAVENOUS | Status: AC
  Filled 2013-04-24: qty 50

## 2013-04-24 MED ORDER — LACTATED RINGERS IV SOLN
INTRAVENOUS | Status: DC
  Administered 2013-04-24: 1000 mL via INTRAVENOUS
  Administered 2013-04-24: 15:00:00 via INTRAVENOUS

## 2013-04-24 MED ORDER — METOCLOPRAMIDE HCL 10 MG PO TABS
5.0000 mg | ORAL_TABLET | Freq: Three times a day (TID) | ORAL | Status: DC | PRN
  Administered 2013-04-24: 5 mg via ORAL
  Filled 2013-04-24: qty 1

## 2013-04-24 MED ORDER — ACETAMINOPHEN 650 MG RE SUPP: 650.0000 mg | RECTAL | Status: DC | PRN

## 2013-04-24 MED ORDER — HYDROMORPHONE HCL PF 1 MG/ML IJ SOLN
0.2500 mg | INTRAMUSCULAR | Status: DC | PRN
  Administered 2013-04-24: 0.5 mg via INTRAVENOUS

## 2013-04-24 MED ORDER — MENTHOL 3 MG MT LOZG
1.0000 | LOZENGE | OROMUCOSAL | Status: DC | PRN
  Filled 2013-04-24: qty 9

## 2013-04-24 MED ORDER — HYDROCODONE-ACETAMINOPHEN 5-325 MG PO TABS
1.0000 | ORAL_TABLET | ORAL | Status: DC | PRN
  Administered 2013-04-25 – 2013-04-26 (×5): 2 via ORAL
  Filled 2013-04-24 (×5): qty 2

## 2013-04-24 MED ORDER — CEFAZOLIN SODIUM-DEXTROSE 2-3 GM-% IV SOLR
2.0000 g | INTRAVENOUS | Status: AC
  Administered 2013-04-24: 2 g via INTRAVENOUS

## 2013-04-24 MED ORDER — PHENYLEPHRINE HCL 10 MG/ML IJ SOLN
INTRAMUSCULAR | Status: AC
  Filled 2013-04-24: qty 2

## 2013-04-24 MED ORDER — SODIUM CHLORIDE 0.9 % IR SOLN
Status: DC | PRN
  Administered 2013-04-24: 13:00:00

## 2013-04-24 MED ORDER — MIDAZOLAM HCL 5 MG/5ML IJ SOLN
INTRAMUSCULAR | Status: DC | PRN
  Administered 2013-04-24 (×2): 1 mg via INTRAVENOUS

## 2013-04-24 MED ORDER — PHENOL 1.4 % MT LIQD: 1.0000 | OROMUCOSAL | Status: DC | PRN

## 2013-04-24 MED ORDER — NEOSTIGMINE METHYLSULFATE 1 MG/ML IJ SOLN
INTRAMUSCULAR | Status: DC | PRN
  Administered 2013-04-24: 5 mg via INTRAVENOUS

## 2013-04-24 MED ORDER — HYDROMORPHONE HCL PF 1 MG/ML IJ SOLN
0.5000 mg | INTRAMUSCULAR | Status: DC | PRN
  Administered 2013-04-24: 1 mg via INTRAVENOUS
  Filled 2013-04-24: qty 1

## 2013-04-24 MED ORDER — MIDAZOLAM HCL 2 MG/2ML IJ SOLN
INTRAMUSCULAR | Status: AC
  Filled 2013-04-24: qty 2

## 2013-04-24 SURGICAL SUPPLY — 43 items
BAG ZIPLOCK 12X15 (MISCELLANEOUS) ×2 IMPLANT
BENZOIN TINCTURE PRP APPL 2/3 (GAUZE/BANDAGES/DRESSINGS) ×2 IMPLANT
CLEANER TIP ELECTROSURG 2X2 (MISCELLANEOUS) ×2 IMPLANT
CONT SPECI 4OZ STER CLIK (MISCELLANEOUS) ×2 IMPLANT
DRAIN PENROSE 18X1/4 LTX STRL (WOUND CARE) IMPLANT
DRAPE MICROSCOPE LEICA (MISCELLANEOUS) ×2 IMPLANT
DRAPE POUCH INSTRU U-SHP 10X18 (DRAPES) ×2 IMPLANT
DRAPE SURG 17X11 SM STRL (DRAPES) ×2 IMPLANT
DRSG ADAPTIC 3X8 NADH LF (GAUZE/BANDAGES/DRESSINGS) ×2 IMPLANT
DRSG EMULSION OIL 3X3 NADH (GAUZE/BANDAGES/DRESSINGS) ×2 IMPLANT
DRSG PAD ABDOMINAL 8X10 ST (GAUZE/BANDAGES/DRESSINGS) ×2 IMPLANT
DURAPREP 26ML APPLICATOR (WOUND CARE) ×2 IMPLANT
ELECT BLADE TIP CTD 4 INCH (ELECTRODE) ×2 IMPLANT
ELECT REM PT RETURN 9FT ADLT (ELECTROSURGICAL) ×2
ELECTRODE REM PT RTRN 9FT ADLT (ELECTROSURGICAL) ×1 IMPLANT
GLOVE BIOGEL PI IND STRL 8 (GLOVE) ×2 IMPLANT
GLOVE BIOGEL PI INDICATOR 8 (GLOVE) ×2
GLOVE ECLIPSE 8.0 STRL XLNG CF (GLOVE) ×4 IMPLANT
GOWN PREVENTION PLUS LG XLONG (DISPOSABLE) ×6 IMPLANT
GOWN STRL REIN XL XLG (GOWN DISPOSABLE) ×4 IMPLANT
KIT BASIN OR (CUSTOM PROCEDURE TRAY) ×2 IMPLANT
KIT POSITIONING SURG ANDREWS (MISCELLANEOUS) ×2 IMPLANT
MANIFOLD NEPTUNE II (INSTRUMENTS) ×2 IMPLANT
NEEDLE SPNL 18GX3.5 QUINCKE PK (NEEDLE) ×4 IMPLANT
NS IRRIG 1000ML POUR BTL (IV SOLUTION) ×2 IMPLANT
PAD ABD 8X10 STRL (GAUZE/BANDAGES/DRESSINGS) ×2 IMPLANT
PATTIES SURGICAL .5 X.5 (GAUZE/BANDAGES/DRESSINGS) IMPLANT
PATTIES SURGICAL .75X.75 (GAUZE/BANDAGES/DRESSINGS) IMPLANT
PATTIES SURGICAL 1X1 (DISPOSABLE) IMPLANT
PIN SAFETY NICK PLATE  2 MED (MISCELLANEOUS)
PIN SAFETY NICK PLATE 2 MED (MISCELLANEOUS) IMPLANT
POSITIONER SURGICAL ARM (MISCELLANEOUS) ×2 IMPLANT
SPONGE LAP 4X18 X RAY DECT (DISPOSABLE) ×2 IMPLANT
SPONGE SURGIFOAM ABS GEL 100 (HEMOSTASIS) ×2 IMPLANT
STAPLER VISISTAT 35W (STAPLE) IMPLANT
SUT VIC AB 0 CT1 27 (SUTURE) ×1
SUT VIC AB 0 CT1 27XBRD ANTBC (SUTURE) ×1 IMPLANT
SUT VIC AB 1 CT1 27 (SUTURE) ×4
SUT VIC AB 1 CT1 27XBRD ANTBC (SUTURE) ×4 IMPLANT
TAPE CLOTH SURG 6X10 WHT LF (GAUZE/BANDAGES/DRESSINGS) ×2 IMPLANT
TOWEL OR 17X26 10 PK STRL BLUE (TOWEL DISPOSABLE) ×4 IMPLANT
TRAY LAMINECTOMY (CUSTOM PROCEDURE TRAY) ×2 IMPLANT
WATER STERILE IRR 1500ML POUR (IV SOLUTION) ×2 IMPLANT

## 2013-04-24 NOTE — Anesthesia Preprocedure Evaluation (Addendum)
Anesthesia Evaluation  Patient identified by MRN, date of birth, ID band Patient awake    Reviewed: Allergy & Precautions, H&P , NPO status , Patient's Chart, lab work & pertinent test results  History of Anesthesia Complications (+) AWARENESS UNDER ANESTHESIA  Airway       Dental  (+) Dental Advisory Given   Pulmonary neg pulmonary ROS,          Cardiovascular hypertension, Pt. on medications     Neuro/Psych PSYCHIATRIC DISORDERS Anxiety negative neurological ROS     GI/Hepatic negative GI ROS, Neg liver ROS,   Endo/Other  diabetes, Type 2, Oral Hypoglycemic Agents  Renal/GU negative Renal ROS     Musculoskeletal negative musculoskeletal ROS (+)   Abdominal   Peds  Hematology negative hematology ROS (+)   Anesthesia Other Findings   Reproductive/Obstetrics                          Anesthesia Physical Anesthesia Plan  ASA: II  Anesthesia Plan: General   Post-op Pain Management:    Induction: Intravenous  Airway Management Planned: Oral ETT  Additional Equipment:   Intra-op Plan:   Post-operative Plan: Extubation in OR  Informed Consent: I have reviewed the patients History and Physical, chart, labs and discussed the procedure including the risks, benefits and alternatives for the proposed anesthesia with the patient or authorized representative who has indicated his/her understanding and acceptance.   Dental advisory given  Plan Discussed with: CRNA  Anesthesia Plan Comments:         Anesthesia Quick Evaluation

## 2013-04-24 NOTE — Transfer of Care (Signed)
Immediate Anesthesia Transfer of Care Note  Patient: Peter Summers  Procedure(s) Performed: Procedure(s): CENTRAL DECOMPRESSIVE LUMBAR LAMINECTOMY L3-4 EXCISION 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  (N/A)  Patient Location: PACU  Anesthesia Type:General  Level of Consciousness: awake, sedated and patient cooperative  Airway & Oxygen Therapy: Patient Spontanous Breathing and Patient connected to face mask oxygen  Post-op Assessment: Report given to PACU RN and Post -op Vital signs reviewed and stable  Post vital signs: Reviewed and stable  Complications: No apparent anesthesia complications

## 2013-04-24 NOTE — Anesthesia Postprocedure Evaluation (Signed)
Anesthesia Post Note  Patient: Peter Summers  Procedure(s) Performed: Procedure(s) (LRB): CENTRAL DECOMPRESSIVE LUMBAR LAMINECTOMY L3-4 EXCISION 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  (N/A)  Anesthesia type: General  Patient location: PACU  Post pain: Pain level controlled  Post assessment: Post-op Vital signs reviewed  Last Vitals: BP 119/77  Pulse 101  Temp(Src) 36.4 C (Oral)  Resp 14  Ht 6\' 1"  (1.854 m)  Wt 186 lb (84.369 kg)  BMI 24.55 kg/m2  SpO2 100%  Post vital signs: Reviewed  Level of consciousness: sedated  Complications: No apparent anesthesia complications

## 2013-04-24 NOTE — Interval H&P Note (Signed)
History and Physical Interval Note:  04/24/2013 12:45 PM  Peter Summers  has presented today for surgery, with the diagnosis of SPINAL STENOSIS  The various methods of treatment have been discussed with the patient and family. After consideration of risks, benefits and other options for treatment, the patient has consented to  Procedure(s): CENTRAL DECOMPRESSIVE LUMBAR LAMINECTOMY L3-4 EXCISION 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  (N/A) as a surgical intervention .  The patient's history has been reviewed, patient examined, no change in status, stable for surgery.  I have reviewed the patient's chart and labs.  Questions were answered to the patient's satisfaction.     Cheikh Bramble A

## 2013-04-24 NOTE — Preoperative (Signed)
Beta Blockers   Reason not to administer Beta Blockers:Not Applicable 

## 2013-04-24 NOTE — Brief Op Note (Signed)
04/24/2013  3:30 PM  PATIENT:  Peter Summers  72 y.o. male  PRE-OPERATIVE DIAGNOSIS:  SPINAL STENOSISL-3-L-4 and L-4-L-5;Large Facet Synovial Cyst L-3-L-4 on the Left  POST-OPERATIVE DIAGNOSIS:  Same as Pre-Op  PROCEDURE:  Procedure(s): CENTRAL DECOMPRESSIVE LUMBAR LAMINECTOMY L3-4 EXCISION 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  (N/A) and Decompression of L-4-L-5 for Spinal Stenosis,TWO Level Decompression.Very Complex case.  SURGEON:  Surgeon(s) and Role:    * Jacki Cones, MD - Primary    * Drucilla Schmidt, MD - Assisting     ASSISTANTS:James Aplington MD   ANESTHESIA:   general  EBL:  Total I/O In: 1000 [I.V.:1000] Out: -   BLOOD ADMINISTERED:none  DRAINS: none   LOCAL MEDICATIONS USED:  MARCAINE20cc of 0.50% with Epinephrine at the beginning of the case and 20cc of Exparel with 20cc of Normal Saline,of which I used only  half of this mixture at end of the case.    SPECIMEN:  Source of Specimen:  L-3-L-4 Facet on the Left.  DISPOSITION OF SPECIMEN:  PATHOLOGY  COUNTS:  YES  TOURNIQUET:  * No tourniquets in log *  DICTATION: .Other Dictation: Dictation Number [id number]  PLAN OF CARE: Admit for overnight observation  PATIENT DISPOSITION:  PACU - hemodynamically stable.   Delay start of Pharmacological VTE agent (>24hrs) due to surgical blood loss or risk of bleeding: yes

## 2013-04-25 LAB — GLUCOSE, CAPILLARY
Glucose-Capillary: 129 mg/dL — ABNORMAL HIGH (ref 70–99)
Glucose-Capillary: 137 mg/dL — ABNORMAL HIGH (ref 70–99)
Glucose-Capillary: 138 mg/dL — ABNORMAL HIGH (ref 70–99)
Glucose-Capillary: 144 mg/dL — ABNORMAL HIGH (ref 70–99)
Glucose-Capillary: 99 mg/dL (ref 70–99)

## 2013-04-25 NOTE — Evaluation (Signed)
Physical Therapy Evaluation Patient Details Name: Peter Summers MRN: [id number] DOB: August 24, 1940 Today's Date: 04/25/2013 Time: 8657-8469 PT Time Calculation (min): 30 min  PT Assessment / Plan / Recommendation History of Present Illness  CENTRAL DECOMPRESSIVE LUMBAR LAMINECTOMY L3-4 EXCISION 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  (N/A) and Decompression of L-4-L-5 for Spinal Stenosis,TWO Level Decompression  Clinical Impression  Pt will benefit from PT to address deficits below    PT Assessment  Patient needs continued PT services    Follow Up Recommendations  No PT follow up    Does the patient have the potential to tolerate intense rehabilitation      Barriers to Discharge        Equipment Recommendations  Rolling walker with 5" wheels    Recommendations for Other Services     Frequency 7X/week    Precautions / Restrictions Precautions Precautions: Back Precaution Booklet Issued: Yes (comment)   Pertinent Vitals/Pain Pain controlled      Mobility  Bed Mobility Bed Mobility: Rolling Left;Sit to Sidelying Left Rolling Left: 4: Min guard Sit to Sidelying Left: 4: Min guard Details for Bed Mobility Assistance: cues for technique, back precautions Transfers Transfers: Sit to Stand;Stand to Sit Sit to Stand: 4: Min guard Stand to Sit: 4: Min guard Details for Transfer Assistance: cues for back precautions, technique Ambulation/Gait Ambulation/Gait Assistance: 5: Supervision Ambulation Distance (Feet): 260 Feet Assistive device: Rolling walker Ambulation/Gait Assistance Details: cues for RW safety and for back precautions during turns Gait Pattern: Step-through pattern General Gait Details: guarded    Exercises     PT Diagnosis: Difficulty walking  PT Problem List: Decreased activity tolerance;Decreased knowledge of use of DME;Decreased knowledge of precautions PT Treatment Interventions: DME instruction;Gait training;Stair training;Patient/family education     PT  Goals(Current goals can be found in the care plan section) Acute Rehab PT Goals Patient Stated Goal: walk!! PT Goal Formulation: With patient Time For Goal Achievement: 04/26/13 Potential to Achieve Goals: Good  Visit Information  Last PT Received On: 04/25/13 Assistance Needed: +1 History of Present Illness: CENTRAL DECOMPRESSIVE LUMBAR LAMINECTOMY L3-4 EXCISION 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  (N/A) and Decompression of L-4-L-5 for Spinal Stenosis,TWO Level Decompression       Prior Functioning  Home Living Family/patient expects to be discharged to:: Private residence Living Arrangements: Spouse/significant other Type of Home: House Home Access: Stairs to enter Secretary/administrator of Steps: 2 Home Layout: One level Home Equipment: Crutches Additional Comments: wife works second shift from 2pm-10pm;  Prior Function Level of Independence: Independent Communication Communication: No difficulties    Cognition  Cognition Arousal/Alertness: Awake/alert Behavior During Therapy: WFL for tasks assessed/performed Overall Cognitive Status: Within Functional Limits for tasks assessed    Extremity/Trunk Assessment Upper Extremity Assessment Upper Extremity Assessment: Defer to OT evaluation Lower Extremity Assessment Lower Extremity Assessment: Overall WFL for tasks assessed   Balance    End of Session PT - End of Session Equipment Utilized During Treatment: Gait belt Activity Tolerance: Patient tolerated treatment well Patient left: in chair;with call bell/phone within reach  GP Functional Assessment Tool Used: clinical judgement Functional Limitation: Mobility: Walking and moving around Mobility: Walking and Moving Around Current Status [phone]): At least 1 percent but less than 20 percent impaired, limited or restricted Mobility: Walking and Moving Around Goal Status [phone]): At least 1 percent but less than 20 percent impaired, limited or restricted   [facility] 04/25/2013,  9:48 AM

## 2013-04-25 NOTE — Evaluation (Signed)
Occupational Therapy Evaluation Patient Details Name: Peter Summers MRN: [id number] DOB: 1941/03/10 Today's Date: 04/25/2013 Time: 4098-1191 OT Time Calculation (min): 29 min  OT Assessment / Plan / Recommendation History of present illness pt was admitted for L3-4 decompression and excision of L synovial cyst   Clinical Impression   Pt was admitted for the above surgery.  All education was completed.  Pt does not need any further OT at this time.    OT Assessment  Patient does not need any further OT services    Follow Up Recommendations  No OT follow up    Barriers to Discharge      Equipment Recommendations  3 in 1 bedside comode    Recommendations for Other Services    Frequency       Precautions / Restrictions Precautions Precautions: Back Precaution Booklet Issued: Yes (comment) Restrictions Weight Bearing Restrictions: No   Pertinent Vitals/Pain Back sore only; pt is excited that LLE feels better    ADL  Grooming: Set up Where Assessed - Grooming: Unsupported sitting Upper Body Bathing: Set up Where Assessed - Upper Body Bathing: Unsupported sitting Lower Body Bathing: Moderate assistance Where Assessed - Lower Body Bathing: Supported sit to stand Upper Body Dressing: Supervision/safety Where Assessed - Upper Body Dressing: Unsupported sitting Lower Body Dressing: Moderate assistance (with reacher) Where Assessed - Lower Body Dressing: Supported sit to stand Toilet Transfer: Hydrographic surveyor Method: Sit to Barista: Raised toilet seat with arms (or 3-in-1 over toilet) Toileting - Clothing Manipulation and Hygiene: Minimal assistance Where Assessed - Toileting Clothing Manipulation and Hygiene: Sit to stand from 3-in-1 or toilet Equipment Used: Reacher;Rolling walker Transfers/Ambulation Related to ADLs: ambulated to bathroom with min guard.  Educated on and demonstrated shower transfer:  pt did not feel he needed to  practice this.  He has a shower seat in his shower ADL Comments: Educated on reacher, which pt used and toilet aid.  Wife will put socks on for pt Reviewed alternative methods for adls   OT Diagnosis:    OT Problem List:   OT Treatment Interventions:     OT Goals(Current goals can be found in the care plan section) Acute Rehab OT Goals Patient Stated Goal: walk!!  Visit Information  Last OT Received On: 04/25/13 Assistance Needed: +1 History of Present Illness: pt was admitted for L3-4 decompression and excision of L synovial cyst       Prior Functioning     Home Living Family/patient expects to be discharged to:: Private residence Living Arrangements: Spouse/significant other Type of Home: House Home Access: Stairs to enter Secretary/administrator of Steps: 2 Home Layout: One level Home Equipment: Crutches Additional Comments: wife works second shift from 2pm-10pm;  Prior Function Level of Independence: Independent Communication Communication: No difficulties         Vision/Perception     Copywriter, advertising Arousal/Alertness: Awake/alert Behavior During Therapy: WFL for tasks assessed/performed Overall Cognitive Status: Within Functional Limits for tasks assessed    Extremity/Trunk Assessment Upper Extremity Assessment Upper Extremity Assessment: Overall WFL for tasks assessed Lower Extremity Assessment Lower Extremity Assessment: Overall WFL for tasks assessed     Mobility Bed Mobility Bed Mobility: Rolling Left;Sit to Sidelying Left Rolling Left: 4: Min guard Sit to Sidelying Left: 4: Min guard Details for Bed Mobility Assistance: cues for technique, back precautions Transfers Sit to Stand: 4: Min guard;From chair/3-in-1;With upper extremity assist Stand to Sit: 4: Min guard;To chair/3-in-1;With upper extremity assist Details for  Transfer Assistance: cues for hand placement     Exercise     Balance     End of Session OT - End of  Session Activity Tolerance: Patient tolerated treatment well Patient left: in chair;with call bell/phone within reach  GO Functional Assessment Tool Used: clinical observation/judgment Functional Limitation: Self care Self Care Current Status (Z6109): At least 40 percent but less than 60 percent impaired, limited or restricted Self Care Goal Status (U0454): At least 40 percent but less than 60 percent impaired, limited or restricted Self Care Discharge Status [phone]): At least 40 percent but less than 60 percent impaired, limited or restricted   [facility] 04/25/2013, 10:32 AM Marica Otter, OTR/L [phone] 04/25/2013

## 2013-04-25 NOTE — Op Note (Signed)
NAMEBURT, PIATEK NO.:  [id number]  MEDICAL RECORD NO.:  [id number]  LOCATION:  1606                         FACILITY:  [facility]  PHYSICIAN:  Georges Lynch. Onell Mcmath, M.D.DATE OF BIRTH:  09/19/1940  DATE OF PROCEDURE:  04/24/2013 DATE OF DISCHARGE:                              OPERATIVE REPORT   SURGEON:  Georges Lynch. Darrelyn Hillock, M.D.  ASSISTANT:  Marlowe Kays, M.D.  PREOPERATIVE DIAGNOSES: 1. 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 2. Spinal stenosis, L4-5, moderate. 3. 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  POSTOPERATIVE DIAGNOSES: 1. 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 2. Spinal stenosis, L4-5, moderate. 3. 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  OPERATION: 1. Complete decompressive lumbar laminectomy at L3-4. 2. Complete decompressive lumbar laminectomy at L4-5. 3. Excision of a large synovial cyst at L3-4 inferior facet on the     left. 4. Foraminotomy for the L4 root on the left.  SPECIMEN:  Sent to the lab.  COUNT:  Needle and sponge count was correct.  DESCRIPTION OF PROCEDURE:  Under general anesthesia as I mentioned and after sterile prep and draping of the patient on spinal frame, the patient had 2 g of IV Ancef.  At this time, we first did the appropriate time-out and also marked the appropriate left side of the back in the holding area because he had left leg pain only.  Two needles were placed in the back for localization purposes.  X-ray was taken.  At that time, an incision was made over the L3-4, L4-5 interspace, bleeders identified and cauterized.  I then went down and brought the microscope in and we did an excision of spinous process at the L3-4 and L4-5 levels.  We then went down and completed a complete central decompressive lumbar laminectomy at the 2 levels.  No great care was taken to protect the dura at all times.  We went  up proximally as far as we had to until we had good open space in the canal.  We then went out laterally made sure we were free in the foramina.  We then went distally until we had good decompression distally as well L4-5.  In advancing distally on the left, there was a large compressive synovial cyst at L3-4 inferior facet on the left.  We went down distal to that and proximal to that, we went out wide, did a partial facetectomy and then removed the cystic material, send it to the lab.  The root and dura now were completely free.  It was very easy to peel this off the dura without injuring the dura.  We then went out into the foramen, made sure it had a nice foraminotomy at that level as well.  Thoroughly irrigated out the canal, there was no other pathology noted.  We closed the wound layers in usual fashion over some loosely applied thrombin-soaked Gelfoam.  At this time, we left a small deep distal and proximal part of the wound open for drainage purposes. The muscle and subcu was closed as I mentioned  with #1 Vicryl suture. We did inject a half of a mixture of 20 mL of Exparel and 20 mL of normal saline into the soft tissue before closed the skin with metal staples.  Sterile Neosporin dressing was applied.  The patient left the operative room in satisfactory condition.          ______________________________ Georges Lynch Darrelyn Hillock, M.D.     RAG/MEDQ  D:  04/24/2013  T:  04/25/2013  Job:  [id number]

## 2013-04-25 NOTE — Progress Notes (Addendum)
Subjective: 1 Day Post-Op Procedure(s) (LRB): CENTRAL DECOMPRESSIVE LUMBAR LAMINECTOMY L3-4 EXCISION 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  (N/A) Patient reports pain as mild. C/o back pain, legs doing well. Has not been OOB yet.  Objective: Vital signs in last 24 hours: Temp:  [97.5 F (36.4 C)-98.2 F (36.8 C)] 98.2 F (36.8 C) (12/20 0606) Pulse Rate:  [90-112] 91 (12/20 0606) Resp:  [10-20] 14 (12/20 0606) BP: (109-172)/(68-85) 115/71 mmHg (12/20 0606) SpO2:  [98 %-100 %] 100 % (12/20 0606) Weight:  [84.369 kg (186 lb)] 84.369 kg (186 lb) (12/19 1643)  Intake/Output from previous day: 12/19 0701 - 12/20 0700 In: 2860 [P.O.:30; I.V.:2675; IV Piggyback:155] Out: 850 [Urine:850] Intake/Output this shift:    No results found for this basename: HGB,  in the last 72 hours No results found for this basename: WBC, RBC, HCT, PLT,  in the last 72 hours No results found for this basename: NA, K, CL, CO2, BUN, CREATININE, GLUCOSE, CALCIUM,  in the last 72 hours No results found for this basename: LABPT, INR,  in the last 72 hours  Neurologically intact ABD soft Neurovascular intact Sensation intact distally Intact pulses distally Dorsiflexion/Plantar flexion intact Incision: dressing C/D/I and no drainage No cellulitis present Compartment soft no calf pain or sign of DVT  Assessment/Plan: 1 Day Post-Op Procedure(s) (LRB): CENTRAL DECOMPRESSIVE LUMBAR LAMINECTOMY L3-4 EXCISION 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  (N/A) Advance diet Up with therapy D/C IV fluids Plan D/C tomorrow after PT today  Olanda Downie M. 04/25/2013, 7:37 AM

## 2013-04-25 NOTE — Progress Notes (Signed)
Utilization Review completed.  

## 2013-04-26 LAB — GLUCOSE, CAPILLARY
Glucose-Capillary: 125 mg/dL — ABNORMAL HIGH (ref 70–99)
Glucose-Capillary: 95 mg/dL (ref 70–99)

## 2013-04-26 MED ORDER — METHOCARBAMOL 500 MG PO TABS: 500.0000 mg | ORAL_TABLET | Freq: Four times a day (QID) | ORAL | Status: DC | PRN

## 2013-04-26 MED ORDER — OXYCODONE-ACETAMINOPHEN 5-325 MG PO TABS: 1.0000 | ORAL_TABLET | ORAL | Status: DC | PRN

## 2013-04-26 NOTE — Progress Notes (Signed)
Correct phone number for pt's wife, Wynona Canes, is [phone]. Peter Summers, [employer]

## 2013-04-26 NOTE — Progress Notes (Signed)
Utilization Review completed.  

## 2013-04-26 NOTE — Progress Notes (Signed)
Subjective: 2 Days Post-Op Procedure(s) (LRB): CENTRAL DECOMPRESSIVE LUMBAR LAMINECTOMY L3-4 EXCISION 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  (N/A) Patient reports pain as 1 on 0-10 scale. His leg pain is now absent. Will DC He had an Extremely large Synovial Cyst. Specimen sent to Lab.   Objective: Vital signs in last 24 hours: Temp:  [98.5 F (36.9 C)-100 F (37.8 C)] 99.7 F (37.6 C) (12/21 0545) Pulse Rate:  [93-104] 93 (12/21 0545) Resp:  [16-20] 16 (12/21 0545) BP: (100-131)/(64-82) 100/64 mmHg (12/21 0545) SpO2:  [93 %-94 %] 93 % (12/21 0545)  Intake/Output from previous day: 12/20 0701 - 12/21 0700 In: 1460 [P.O.:960; I.V.:450; IV Piggyback:50] Out: 1450 [Urine:1450] Intake/Output this shift:    No results found for this basename: HGB,  in the last 72 hours No results found for this basename: WBC, RBC, HCT, PLT,  in the last 72 hours No results found for this basename: NA, K, CL, CO2, BUN, CREATININE, GLUCOSE, CALCIUM,  in the last 72 hours No results found for this basename: LABPT, INR,  in the last 72 hours  Neurologically intact  Assessment/Plan: 2 Days Post-Op Procedure(s) (LRB): CENTRAL DECOMPRESSIVE LUMBAR LAMINECTOMY L3-4 EXCISION 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  (N/A) Discharge home with home health  Peter Summers A 04/26/2013, 8:19 AM

## 2013-04-26 NOTE — Progress Notes (Signed)
Discharged from floor via w/c, wife with pt. No changes in assessmemt. Manu Rubey, [employer]

## 2013-04-26 NOTE — Progress Notes (Signed)
Physical Therapy Treatment Patient Details Name: DOYAL SARIC MRN: [id number] DOB: 06-26-1940 Today's Date: 04/26/2013 Time: 0921-0955 PT Time Calculation (min): 34 min  PT Assessment / Plan / Recommendation  History of Present Illness pt was admitted for L3-4 decompression and excision of L synovial cyst   PT Comments   Pt doing well this am, painful but better after mobility  Follow Up Recommendations  No PT follow up;Home health PT (MD recommended HHPT, agree if ok with MD)     Does the patient have the potential to tolerate intense rehabilitation     Barriers to Discharge        Equipment Recommendations  Rolling walker with 5" wheels    Recommendations for Other Services    Frequency     Progress towards PT Goals Progress towards PT goals: Progressing toward goals  Plan Current plan remains appropriate    Precautions / Restrictions Precautions Precautions: Back Precaution Comments: reviewed back precautions Restrictions Weight Bearing Restrictions: No   Pertinent Vitals/Pain     Mobility  Bed Mobility Bed Mobility: Sit to Sidelying Left Sit to Sidelying Left: 4: Min guard Details for Bed Mobility Assistance: cues for technique, back precautions Transfers Transfers: Sit to Stand;Stand to Sit Sit to Stand: 5: Supervision Stand to Sit: 5: Supervision Details for Transfer Assistance: cues for hand placement Ambulation/Gait Ambulation/Gait Assistance: 5: Supervision Ambulation Distance (Feet): 300 Feet Assistive device: Rolling walker Ambulation/Gait Assistance Details: cues for safety with turns Gait Pattern: Step-through pattern Stairs: Yes Stairs Assistance: 4: Min assist Stairs Assistance Details (indicate cue type and reason): cues for sequence Stair Management Technique: No rails;Forwards;With walker (walker on side) Number of Stairs: 4    Exercises     PT Diagnosis:    PT Problem List:   PT Treatment Interventions:     PT Goals (current  goals can now be found in the care plan section) Acute Rehab PT Goals Patient Stated Goal: walk!! Time For Goal Achievement: 04/26/13 Potential to Achieve Goals: Good  Visit Information  Last PT Received On: 04/26/13 Assistance Needed: +1 History of Present Illness: pt was admitted for L3-4 decompression and excision of L synovial cyst    Subjective Data  Subjective: I am hurting Patient Stated Goal: walk!!   Cognition  Cognition Arousal/Alertness: Awake/alert Behavior During Therapy: WFL for tasks assessed/performed Overall Cognitive Status: Within Functional Limits for tasks assessed    Balance     End of Session PT - End of Session Equipment Utilized During Treatment: Gait belt Activity Tolerance: Patient tolerated treatment well Patient left: in bed;with call bell/phone within reach   GP Functional Assessment Tool Used: clinical judgement Functional Limitation: Mobility: Walking and moving around Mobility: Walking and Moving Around Goal Status [phone]): At least 1 percent but less than 20 percent impaired, limited or restricted Mobility: Walking and Moving Around Discharge Status [phone]): At least 1 percent but less than 20 percent impaired, limited or restricted   [facility] 04/26/2013, 10:06 AM

## 2013-04-26 NOTE — Progress Notes (Addendum)
   CARE MANAGEMENT NOTE 04/26/2013  Patient:  Peter Summers, Peter Summers   Account Number:  [id number]  Date Initiated:  04/26/2013  Documentation initiated by:  [facility]  Subjective/Objective Assessment:   CENTRAL DECOMPRESSIVE LUMBAR LAMINECTOMY L3-4 EXCISION 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     Action/Plan:   HH   Anticipated DC Date:  04/26/2013   Anticipated DC Plan:  HOME W HOME HEALTH SERVICES      DC Planning Services  CM consult      [facility] Choice  HOME HEALTH   Choice offered to / List presented to:  C-1 Patient   DME arranged  3-N-1  Levan Hurst      DME agency  [facility]     HH arranged  HH-2 PT  HH-1 RN      [facility] agency  [facility]   Status of service:  Completed, signed off Medicare Important Message given?   (If response is "NO", the following Medicare IM given date fields will be blank) Date Medicare IM given:   Date Additional Medicare IM given:    Discharge Disposition:  HOME W HOME HEALTH SERVICES  Per UR Regulation:    If discussed at Long Length of Stay Meetings, dates discussed:    Comments:  04/26/2013 1646 NCM contacted pt at home number. Pt states RW and 3n1 needed for home. [facility] will deliver to his home this evening. Offered choice for [facility]. Pt agreeable to [facility] for HHPT/RN. Contacted [facility] for [facility] PT/RN and can do soc for 04/27/2013 or 04/28/2013. Isidoro Donning RN CCM Case Mgmt phone [phone]

## 2013-04-26 NOTE — Discharge Summary (Signed)
Physician Discharge Summary   Patient ID: Peter Summers MRN: [id number] DOB/AGE: [birth year] 72 y.o.  Admit date: 04/24/2013 Discharge date: 04/26/2013  Primary Diagnosis: Lumbar spinal stenosis and synovial cyst  Admission Diagnoses:  Past Medical History  Diagnosis Date  . Hypertension   . Diabetes mellitus   . Anxiety   . Enlarged prostate    Discharge Diagnoses:   Active Problems:   Synovial cyst of lumbar facet joint   Spinal stenosis, lumbar region, with neurogenic claudication  Estimated body mass index is 24.55 kg/(m^2) as calculated from the following:   Height as of this encounter: 6\' 1"  (1.854 m).   Weight as of this encounter: 84.369 kg (186 lb).  Procedure:  Procedure(s) (LRB): CENTRAL DECOMPRESSIVE LUMBAR LAMINECTOMY L3-4 EXCISION 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  (N/A)   Consults: None  HPI: Peter Summers presented to Dr. Markus Jarvis office with the chief complaint of low back pain. Symptoms reported today include: pain, aching, throbbing, stiffness, pain with weightbearing, difficulty ambulating, difficulty arising from chair, burning, leg pain, pain with lying, pain with lifting, pain with sitting and pain with standing. He has been dealing with these symptoms for about 7 months. Peter Summers presented to Dr. Darrelyn Hillock with severe pain in the left gluteal region radiating around the left thigh to the left knee. This has been present since July. He was seen by Dr. Penni Bombard and he had an MRI of his lumbar spine and an MRI of his left thigh. The left thigh MRI was not impressive. The low back, there were questionable issues at L3-4 on the left. Dr. Ethelene Hal did a selective nerve root block with little relief.   Laboratory Data: Admission on 04/24/2013, Discharged on 04/26/2013  Component Date Value Range Status  . Glucose-Capillary 04/24/2013 118* 70 - 99 mg/dL Final  . Comment 1 40/98/1191 Documented in Chart   Final  . Glucose-Capillary 04/24/2013 134* 70 - 99 mg/dL Final  . Comment  1 47/82/9562 Documented in Chart   Final  . Comment 2 04/24/2013 Notify RN   Final  . Glucose-Capillary 04/24/2013 126* 70 - 99 mg/dL Final  . Glucose-Capillary 04/25/2013 137* 70 - 99 mg/dL Final  . Comment 1 13/12/6576 Notify RN   Final  . Glucose-Capillary 04/25/2013 99  70 - 99 mg/dL Final  . Comment 1 46/96/2952 Documented in Chart   Final  . Comment 2 04/25/2013 Notify RN   Final  . Glucose-Capillary 04/25/2013 138* 70 - 99 mg/dL Final  . Comment 1 84/13/2440 Documented in Chart   Final  . Comment 2 04/25/2013 Notify RN   Final  . Glucose-Capillary 04/25/2013 129* 70 - 99 mg/dL Final  . Comment 1 03/03/2535 Documented in Chart   Final  . Comment 2 04/25/2013 Notify RN   Final  . Glucose-Capillary 04/25/2013 144* 70 - 99 mg/dL Final  . Comment 1 64/40/3474 Documented in Chart   Final  . Comment 2 04/25/2013 Notify RN   Final  . Glucose-Capillary 04/26/2013 125* 70 - 99 mg/dL Final  . Comment 1 25/95/6387 Documented in Chart   Final  . Comment 2 04/26/2013 Notify RN   Final  . Glucose-Capillary 04/26/2013 95  70 - 99 mg/dL Final  . Comment 1 56/43/3295 Documented in Chart   Final  . Comment 2 04/26/2013 Notify RN   Final  Hospital Outpatient Visit on 04/21/2013  Component Date Value Range Status  . aPTT 04/21/2013 27  24 - 37 seconds Final  . Sodium 04/21/2013 136  135 -  145 mEq/L Final  . Potassium 04/21/2013 4.2  3.5 - 5.1 mEq/L Final   Comment: SLIGHT HEMOLYSIS                          HEMOLYSIS AT THIS LEVEL MAY AFFECT RESULT  . Chloride 04/21/2013 98  96 - 112 mEq/L Final  . CO2 04/21/2013 27  19 - 32 mEq/L Final  . Glucose, Bld 04/21/2013 131* 70 - 99 mg/dL Final  . BUN 28/41/3244 12  6 - 23 mg/dL Final  . Creatinine, Ser 04/21/2013 0.76  0.50 - 1.35 mg/dL Final  . Calcium 05/09/7251 10.0  8.4 - 10.5 mg/dL Final  . Total Protein 04/21/2013 7.7  6.0 - 8.3 g/dL Final  . Albumin 66/44/0347 4.1  3.5 - 5.2 g/dL Final  . AST 42/59/5638 29  0 - 37 U/L Final   Comment: SLIGHT  HEMOLYSIS                          HEMOLYSIS AT THIS LEVEL MAY AFFECT RESULT  . ALT 04/21/2013 26  0 - 53 U/L Final   Comment: SLIGHT HEMOLYSIS                          HEMOLYSIS AT THIS LEVEL MAY AFFECT RESULT  . Alkaline Phosphatase 04/21/2013 71  39 - 117 U/L Final  . Total Bilirubin 04/21/2013 0.2* 0.3 - 1.2 mg/dL Final  . GFR calc non Af Amer 04/21/2013 89* >90 mL/min Final  . GFR calc Af Amer 04/21/2013 >90  >90 mL/min Final   Comment: (NOTE)                          The eGFR has been calculated using the CKD EPI equation.                          This calculation has not been validated in all clinical situations.                          eGFR's persistently <90 mL/min signify possible Chronic Kidney                          Disease.  Marland Kitchen Prothrombin Time 04/21/2013 13.2  11.6 - 15.2 seconds Final  . INR 04/21/2013 1.02  0.00 - 1.49 Final  . Color, Urine 04/21/2013 YELLOW  YELLOW Final  . APPearance 04/21/2013 CLEAR  CLEAR Final  . Specific Gravity, Urine 04/21/2013 1.015  1.005 - 1.030 Final  . pH 04/21/2013 5.5  5.0 - 8.0 Final  . Glucose, UA 04/21/2013 250* NEGATIVE mg/dL Final  . Hgb urine dipstick 04/21/2013 NEGATIVE  NEGATIVE Final  . Bilirubin Urine 04/21/2013 NEGATIVE  NEGATIVE Final  . Ketones, ur 04/21/2013 NEGATIVE  NEGATIVE mg/dL Final  . Protein, ur 75/64/3329 NEGATIVE  NEGATIVE mg/dL Final  . Urobilinogen, UA 04/21/2013 0.2  0.0 - 1.0 mg/dL Final  . Nitrite 51/88/4166 NEGATIVE  NEGATIVE Final  . Leukocytes, UA 04/21/2013 NEGATIVE  NEGATIVE Final   MICROSCOPIC NOT DONE ON URINES WITH NEGATIVE PROTEIN, BLOOD, LEUKOCYTES, NITRITE, OR GLUCOSE <1000 mg/dL.  Marland Kitchen MRSA, PCR 04/21/2013 NEGATIVE  NEGATIVE Final  . Staphylococcus aureus 04/21/2013 NEGATIVE  NEGATIVE Final   Comment:  The Xpert SA Assay (FDA                          approved for NASAL specimens                          in patients over 60 years of age),                          is  one component of                          a comprehensive surveillance                          program.  Test performance has                          been validated by [employer] for patients greater                          than or equal to 59 year old.                          It is not intended                          to diagnose infection nor to                          guide or monitor treatment.  . WBC 04/21/2013 6.9  4.0 - 10.5 K/uL Final  . RBC 04/21/2013 4.46  4.22 - 5.81 MIL/uL Final  . Hemoglobin 04/21/2013 12.9* 13.0 - 17.0 g/dL Final  . HCT 52/84/1324 38.0* 39.0 - 52.0 % Final  . MCV 04/21/2013 85.2  78.0 - 100.0 fL Final  . MCH 04/21/2013 28.9  26.0 - 34.0 pg Final  . MCHC 04/21/2013 33.9  30.0 - 36.0 g/dL Final  . RDW 40/02/2724 15.3  11.5 - 15.5 % Final  . Platelets 04/21/2013 262  150 - 400 K/uL Final     X-Rays:Dg Chest 2 View  04/21/2013   CLINICAL DATA:  Hypertension.  EXAM: CHEST  2 VIEW  COMPARISON:  None.  FINDINGS: Mediastinum and hilar structures are normal. Mild subsegmental atelectasis left lung base noted. There is mild elevation left hemidiaphragm. Lungs are otherwise clear. No pleural effusion or pneumothorax. The heart size is normal. Degenerative changes thoracic spine and both shoulders.  IMPRESSION: Mild elevation left hemidiaphragm, otherwise negative chest.   Electronically Signed   By: Maisie Fus  Register   On: 04/21/2013 11:57   Dg Lumbar Spine 2-3 Views  04/21/2013   CLINICAL DATA:  Preop lumbar surgery. Surgeon requests spine labeling.  EXAM: LUMBAR SPINE - 2-3 VIEW  COMPARISON:  Outside MRI 03/02/2013.  FINDINGS: 5 lumbar type vertebral bodies are assumed. There is disc space narrowing at L4-5 most notably. 3 mm of facet mediated anterolisthesis is present at L3-L4 with slight disc space narrowing. No worrisome osseous lesions.  IMPRESSION: Lumbar spondylosis as described.  Electronically Signed   By: Davonna Belling M.D.    On: 04/21/2013 11:35   Dg Spine Portable 1 View  04/24/2013   CLINICAL DATA:  Decompression L3-4.  EXAM: PORTABLE SPINE - 1 VIEW  COMPARISON:  Lumbar spine radiographs earlier the same day  FINDINGS: Surgical probe has been moved more inferiorly, with tip now dorsal to the mid L4 vertebral body. Posterior tissue retractors remain in place.  IMPRESSION: Surgical probe dorsal to the mid L4 vertebral body.   Electronically Signed   By: Sebastian Ache   On: 04/24/2013 14:36   Dg Spine Portable 1 View  04/24/2013   CLINICAL DATA:  Central decompression at L3-4.  EXAM: PORTABLE SPINE - 1 VIEW  COMPARISON:  Lumbar spine radiograph earlier the same day  FINDINGS: Surgical probe has now been placed more inferiorly, with tip dorsal to the L3-4 disc space level. Surgical retractors are present in the overlying soft tissues.  IMPRESSION: Surgical probe dorsal to L3-4 disc space.   Electronically Signed   By: Sebastian Ache   On: 04/24/2013 14:06   Dg Spine Portable 1 View  04/24/2013   CLINICAL DATA:  Lateral lumbar spine portable view for surgical localization.  EXAM: PORTABLE SPINE - 1 VIEW  COMPARISON:  04/1913 at 1317 hr  FINDINGS: Surgical probe has its tip superimposed over the superior margin of the bony spinous process, posterior to the mid body of L3.  IMPRESSION: Surgical localization image as detailed.   Electronically Signed   By: Amie Portland M.D.   On: 04/24/2013 13:43   Dg Spine Portable 1 View  04/24/2013   CLINICAL DATA:  Lumbar decompression  EXAM: PORTABLE SPINE - 1 VIEW  COMPARISON:  04/21/2013  FINDINGS: Five lumbar type vertebral bodies are again well visualized. There are numbered utilizing the nomenclature on the prior exam. Mild anterolisthesis of L3 on L4 is again seen. Needles are noted within the soft tissues posterior to the L3-4 interspace. Degenerative changes are again noted.   Electronically Signed   By: Alcide Clever M.D.   On: 04/24/2013 13:29    EKG: Orders placed during the  hospital encounter of 04/21/13  . EKG 12-LEAD  . EKG 12-LEAD     Hospital Course: Peter Summers is a 72 y.o. who was admitted to [facility]. Peter Summers were brought to the operating room on 04/24/2013 and underwent Procedure(s): CENTRAL DECOMPRESSIVE LUMBAR LAMINECTOMY L3-4 EXCISION 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 .  Patient tolerated the procedure well and was later transferred to the recovery room and then to the orthopaedic floor for postoperative care.  Peter Summers were given PO and IV analgesics for pain control following their surgery.  Peter Summers were given 24 hours of postoperative antibiotics of  Anti-infectives   Start     Dose/Rate Route Frequency Ordered Stop   04/24/13 2000  ceFAZolin (ANCEF) IVPB 1 g/50 mL premix     1 g 100 mL/hr over 30 Minutes Intravenous Every 8 hours 04/24/13 1700 04/25/13 1253   04/24/13 1320  polymyxin B 500,000 Units, bacitracin 50,000 Units in sodium chloride irrigation 0.9 % 500 mL irrigation  Status:  Discontinued       As needed 04/24/13 1322 04/24/13 1527   04/24/13 1000  ceFAZolin (ANCEF) IVPB 2 g/50 mL premix     2 g 100 mL/hr over 30 Minutes Intravenous On call to O.R. 04/24/13 1000 04/24/13 1302     and started on DVT prophylaxis in the form of Aspirin.  PT was ordered for gait training.  Discharge planning consulted to help with postop disposition and equipment needs.  Patient had a decent night on the evening of surgery.  Peter Summers started to get up OOB with therapy on day one. Continued to work with therapy into day two.  Dressing was changed on day two and the incision was clean and dry.  Incision was healing well.  Patient was seen in rounds and was ready to go home.   Discharge Medications: Prior to Admission medications   Medication Sig Start Date End Date Taking? Authorizing Provider  clonazePAM (KLONOPIN) 1 MG tablet Take 1 mg by mouth 2 (two) times daily.   Yes Historical Provider, MD  ferrous fumarate (HEMOCYTE - 106 MG FE) 325 (106 FE) MG TABS  tablet Take 1 tablet by mouth daily.   Yes Historical Provider, MD  finasteride (PROSCAR) 5 MG tablet Take 5 mg by mouth daily.   Yes Historical Provider, MD  lisinopril-hydrochlorothiazide (PRINZIDE,ZESTORETIC) 20-12.5 MG per tablet Take 1 tablet by mouth every morning.   Yes Historical Provider, MD  metFORMIN (GLUCOPHAGE-XR) 500 MG 24 hr tablet Take 500 mg by mouth daily with breakfast.    Yes Historical Provider, MD  Multiple Vitamin (MULTIVITAMIN WITH MINERALS) TABS tablet Take 1 tablet by mouth daily.   Yes Historical Provider, MD  methocarbamol (ROBAXIN) 500 MG tablet Take 1 tablet (500 mg total) by mouth every 6 (six) hours as needed for muscle spasms. 04/26/13   Arilynn Blakeney Tamala Ser, PA-C  oxyCODONE-acetaminophen (PERCOCET/ROXICET) 5-325 MG per tablet Take 1-2 tablets by mouth every 4 (four) hours as needed for moderate pain. 04/26/13   Veralyn Lopp Tamala Ser, PA-C    Diet: Diabetic diet Activity:WBAT Follow-up:in 2 weeks Disposition - Home Discharged Condition: good   Discharge Orders   Future Orders Complete By Expires   Call MD / Call 911  As directed    Comments:     If you experience chest pain or shortness of breath, CALL 911 and be transported to the hospital emergency room.  If you develope a fever above 101 F, pus (white drainage) or increased drainage or redness at the wound, or calf pain, call your surgeon's office.   Constipation Prevention  As directed    Comments:     Drink plenty of fluids.  Prune juice may be helpful.  You may use a stool softener, such as Colace (over the counter) 100 mg twice a day.  Use MiraLax (over the counter) for constipation as needed.   Diet Carb Modified  As directed    Discharge instructions  As directed    Comments:     You may shower starting Sunday For the first few days, remove your dressing, tape a piece of saran wrap over your incision, take your shower, then remove the saran wrap and put a clean dressing on After two days you  can shower without the saran wrap.  Shower only, no tub bath. Call if any temperatures greater than 101 or any wound complications: [phone] during the day and ask for Dr. Jeannetta Ellis nurse, Mackey Birchwood.   Driving restrictions  As directed    Comments:     No driving for 1 week or while taking pain medication   Increase activity slowly as tolerated  As directed    Lifting restrictions  As directed    Comments:     No lifting       Medication List         clonazePAM  1 MG tablet  Commonly known as:  KLONOPIN  Take 1 mg by mouth 2 (two) times daily.     ferrous fumarate 325 (106 FE) MG Tabs tablet  Commonly known as:  HEMOCYTE - 106 mg FE  Take 1 tablet by mouth daily.     finasteride 5 MG tablet  Commonly known as:  PROSCAR  Take 5 mg by mouth daily.     lisinopril-hydrochlorothiazide 20-12.5 MG per tablet  Commonly known as:  PRINZIDE,ZESTORETIC  Take 1 tablet by mouth every morning.     metFORMIN 500 MG 24 hr tablet  Commonly known as:  GLUCOPHAGE-XR  Take 500 mg by mouth daily with breakfast.     methocarbamol 500 MG tablet  Commonly known as:  ROBAXIN  Take 1 tablet (500 mg total) by mouth every 6 (six) hours as needed for muscle spasms.     multivitamin with minerals Tabs tablet  Take 1 tablet by mouth daily.     oxyCODONE-acetaminophen 5-325 MG per tablet  Commonly known as:  PERCOCET/ROXICET  Take 1-2 tablets by mouth every 4 (four) hours as needed for moderate pain.           Follow-up Information   Follow up with GIOFFRE,RONALD A, MD. Schedule an appointment as soon as possible for a visit in 2 weeks.   Specialty:  Orthopedic Surgery   Contact information:   [address] [place] [address] [phone]       Signed: Kerby Nora 04/26/2013, 8:45 PM

## 2013-04-27 ENCOUNTER — Encounter (HOSPITAL_COMMUNITY): Payer: Self-pay | Admitting: Orthopedic Surgery

## 2013-12-09 ENCOUNTER — Other Ambulatory Visit: Payer: Self-pay | Admitting: Gastroenterology

## 2013-12-09 DIAGNOSIS — R1013 Epigastric pain: Secondary | ICD-10-CM

## 2013-12-15 ENCOUNTER — Ambulatory Visit
Admission: RE | Admit: 2013-12-15 | Discharge: 2013-12-15 | Disposition: A | Discharge status: Home / Self Care | Payer: Medicare Other | Source: Ambulatory Visit | Attending: Gastroenterology | Admitting: Gastroenterology

## 2013-12-15 DIAGNOSIS — R1013 Epigastric pain: Secondary | ICD-10-CM

## 2013-12-16 ENCOUNTER — Other Ambulatory Visit: Payer: Self-pay | Admitting: Gastroenterology

## 2013-12-16 DIAGNOSIS — R1013 Epigastric pain: Secondary | ICD-10-CM

## 2013-12-16 DIAGNOSIS — R935 Abnormal findings on diagnostic imaging of other abdominal regions, including retroperitoneum: Secondary | ICD-10-CM

## 2013-12-23 ENCOUNTER — Ambulatory Visit
Admission: RE | Admit: 2013-12-23 | Discharge: 2013-12-23 | Disposition: A | Discharge status: Home / Self Care | Payer: Medicare Other | Source: Ambulatory Visit | Attending: Gastroenterology | Admitting: Gastroenterology

## 2013-12-23 DIAGNOSIS — R935 Abnormal findings on diagnostic imaging of other abdominal regions, including retroperitoneum: Secondary | ICD-10-CM

## 2013-12-23 DIAGNOSIS — R1013 Epigastric pain: Secondary | ICD-10-CM

## 2013-12-23 MED ORDER — GADOBENATE DIMEGLUMINE 529 MG/ML IV SOLN
18.0000 mL | Freq: Once | INTRAVENOUS | Status: AC | PRN
  Administered 2013-12-23: 18 mL via INTRAVENOUS

## 2013-12-25 ENCOUNTER — Other Ambulatory Visit: Payer: Self-pay | Admitting: Gastroenterology

## 2014-01-01 ENCOUNTER — Encounter (HOSPITAL_COMMUNITY): Payer: Self-pay | Admitting: *Deleted

## 2014-01-01 ENCOUNTER — Encounter (HOSPITAL_COMMUNITY)
Admission: RE | Disposition: A | Discharge status: Home / Self Care | Payer: Self-pay | Source: Ambulatory Visit | Attending: Gastroenterology

## 2014-01-01 ENCOUNTER — Ambulatory Visit (HOSPITAL_COMMUNITY)
Admission: RE | Admit: 2014-01-01 | Discharge: 2014-01-01 | Disposition: A | Discharge status: Home / Self Care | Payer: Medicare Other | Source: Ambulatory Visit | Attending: Gastroenterology | Admitting: Gastroenterology

## 2014-01-01 DIAGNOSIS — K831 Obstruction of bile duct: Secondary | ICD-10-CM | POA: Diagnosis not present

## 2014-01-01 DIAGNOSIS — I10 Essential (primary) hypertension: Secondary | ICD-10-CM | POA: Insufficient documentation

## 2014-01-01 DIAGNOSIS — D490 Neoplasm of unspecified behavior of digestive system: Secondary | ICD-10-CM | POA: Insufficient documentation

## 2014-01-01 DIAGNOSIS — E119 Type 2 diabetes mellitus without complications: Secondary | ICD-10-CM | POA: Insufficient documentation

## 2014-01-01 DIAGNOSIS — K869 Disease of pancreas, unspecified: Secondary | ICD-10-CM | POA: Diagnosis present

## 2014-01-01 HISTORY — PX: EUS: SHX5427

## 2014-01-01 LAB — GLUCOSE, CAPILLARY: Glucose-Capillary: 140 mg/dL — ABNORMAL HIGH (ref 70–99)

## 2014-01-01 SURGERY — UPPER ENDOSCOPIC ULTRASOUND (EUS) LINEAR
Anesthesia: Moderate Sedation

## 2014-01-01 MED ORDER — FENTANYL CITRATE 0.05 MG/ML IJ SOLN
INTRAMUSCULAR | Status: AC
  Filled 2014-01-01: qty 4

## 2014-01-01 MED ORDER — FENTANYL CITRATE 0.05 MG/ML IJ SOLN
INTRAMUSCULAR | Status: DC | PRN
  Administered 2014-01-01 (×4): 25 ug via INTRAVENOUS

## 2014-01-01 MED ORDER — BUTAMBEN-TETRACAINE-BENZOCAINE 2-2-14 % EX AERO
INHALATION_SPRAY | CUTANEOUS | Status: DC | PRN
  Administered 2014-01-01: 2 via TOPICAL

## 2014-01-01 MED ORDER — MIDAZOLAM HCL 10 MG/2ML IJ SOLN
INTRAMUSCULAR | Status: DC | PRN
  Administered 2014-01-01 (×5): 2 mg via INTRAVENOUS

## 2014-01-01 MED ORDER — MIDAZOLAM HCL 10 MG/2ML IJ SOLN
INTRAMUSCULAR | Status: AC
  Filled 2014-01-01: qty 4

## 2014-01-01 MED ORDER — DIPHENHYDRAMINE HCL 50 MG/ML IJ SOLN
INTRAMUSCULAR | Status: DC | PRN
  Administered 2014-01-01: 25 mg via INTRAVENOUS

## 2014-01-01 MED ORDER — DIPHENHYDRAMINE HCL 50 MG/ML IJ SOLN
INTRAMUSCULAR | Status: AC
  Filled 2014-01-01: qty 1

## 2014-01-01 MED ORDER — SODIUM CHLORIDE 0.9 % IV SOLN
INTRAVENOUS | Status: DC
  Administered 2014-01-01: 500 mL via INTRAVENOUS

## 2014-01-01 NOTE — Op Note (Signed)
[facility] [place], [address]   ENDOSCOPIC ULTRASOUND PROCEDURE REPORT  PATIENT: Peter Summers, Peter Summers  MR#: [id number] BIRTHDATE: 1940-12-30  GENDER: Male ENDOSCOPIST: Carol Ada, MD REFERRED BY: PROCEDURE DATE:  01/01/2014 PROCEDURE:   EUS with FNA ASA CLASS:      Class II INDICATIONS:   Pancreatic head mass MEDICATIONS: Versed 10 mg IV, Fentanyl 100 mcg IV, and Benadryl 25 mg IV  DESCRIPTION OF PROCEDURE:   After the risks benefits and alternatives of the procedure were  explained, informed consent was obtained. The patient was then placed in the left, lateral, decubitus postion and IV sedation was administered. Throughout the procedure, the patients blood pressure, pulse and oxygen saturations were monitored continuously.  Under direct visualization, the     endoscope was introduced through the mouth and advanced to the bulb of duodenum .  Water was used as necessary to provide an acoustic interface.  Upon completion of the imaging, water was removed and the patient was sent to the recovery room in satisfactory condition.    FINDINGS:  In the head of the pancreas a very large hypoechoic irregularly bordered mass was identified.  The mass measured 3.5 cm x 5 cm at its greatest dimension.  There was one focal area of possible SMV/Portal vein involvement.  The CBD was measured at 8 mm and there was a distinct cut off and the PD measured 4 mm.  The celiac axis was negative for any lymph nodes.  Five passes with the 25 gauge FNA needle were performed.   No overt peripancreatic lymph nodes.  The scope was then withdrawn from the patient and the procedure completed.  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 2) CBD obstruction.  RECOMMENDATIONS: 1) Await FNA results. 2) In the near future an ERCP with stent placement will be required. Plastic versus metallic stent  resectability.  _______________________________ eSigned:  Carol Ada, MD 01/01/2014 2:21 PM   CC:

## 2014-01-01 NOTE — H&P (Signed)
  Peter Summers HPI: This is a 73 year old male with identified to have a 2 x 2 cm pancreatic head mass on MRI.    Past Medical History  Diagnosis Date  . Hypertension   . Diabetes mellitus   . Anxiety   . Enlarged prostate     Past Surgical History  Procedure Laterality Date  . Hernia repair  1980    hiatal hernia  . Appendectomy    . Rotator cuff repair Bilateral   . Lumbar laminectomy/decompression microdiscectomy N/A 04/24/2013    Procedure: CENTRAL DECOMPRESSIVE LUMBAR LAMINECTOMY L3-4 EXCISION 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 ;  Surgeon: Tobi Bastos, MD;  Location: [facility];  Service: Orthopedics;  Laterality: N/A;    Family History  Problem Relation Age of Onset  . Diabetes Father     Social History:  reports that he has never smoked. He has never used smokeless tobacco. He reports that he uses illicit drugs. He reports that he does not drink alcohol.  Allergies: No Known Allergies  Medications: Scheduled: Continuous:  No results found for this or any previous visit (from the past 24 hour(s)).   No results found.  ROS:  As stated above in the HPI otherwise negative.  There were no vitals taken for this visit.    PE: Gen: NAD, Alert and Oriented HEENT:  /AT, EOMI Neck: Supple, no LAD Lungs: CTA Bilaterally CV: RRR without M/G/R ABM: Soft, NTND, +BS Ext: No C/C/E  Assessment/Plan: 1) Pancreatic head mass.  Plan: 1) EUS with FNA.  [name] D 01/01/2014, 8:04 AM

## 2014-01-01 NOTE — Discharge Instructions (Signed)
Conscious Sedation, Adult, Care After °Refer to this sheet in the next few weeks. These instructions provide you with information on caring for yourself after your procedure. Your health care provider may also give you more specific instructions. Your treatment has been planned according to current medical practices, but problems sometimes occur. Call your health care provider if you have any problems or questions after your procedure. °WHAT TO EXPECT AFTER THE PROCEDURE  °After your procedure: °· You may feel sleepy, clumsy, and have poor balance for several hours. °· Vomiting may occur if you eat too soon after the procedure. °HOME CARE INSTRUCTIONS °· Do not participate in any activities where you could become injured for at least 24 hours. Do not: °· Drive. °· Swim. °· Ride a bicycle. °· Operate heavy machinery. °· Cook. °· Use power tools. °· Climb ladders. °· Work from a high place. °· Do not make important decisions or sign legal documents until you are improved. °· If you vomit, drink water, juice, or soup when you can drink without vomiting. Make sure you have little or no nausea before eating solid foods. °· Only take over-the-counter or prescription medicines for pain, discomfort, or fever as directed by your health care provider. °· Make sure you and your family fully understand everything about the medicines given to you, including what side effects may occur. °· You should not drink alcohol, take sleeping pills, or take medicines that cause drowsiness for at least 24 hours. °· If you smoke, do not smoke without supervision. °· If you are feeling better, you may resume normal activities 24 hours after you were sedated. °· Keep all appointments with your health care provider. °SEEK MEDICAL CARE IF: °· Your skin is pale or bluish in color. °· You continue to feel nauseous or vomit. °· Your pain is getting worse and is not helped by medicine. °· You have bleeding or swelling. °· You are still sleepy or  feeling clumsy after 24 hours. °SEEK IMMEDIATE MEDICAL CARE IF: °· You develop a rash. °· You have difficulty breathing. °· You develop any type of allergic problem. °· You have a fever. °MAKE SURE YOU: °· Understand these instructions. °· Will watch your condition. °· Will get help right away if you are not doing well or get worse. °Document Released: 02/11/2013 Document Reviewed: 02/11/2013 °ExitCare® Patient Information ©2015 [employer]. This information is not intended to replace advice given to you by your health care provider. Make sure you discuss any questions you have with your health care provider. ° °Esophagogastroduodenoscopy °Care After °Refer to this sheet in the next few weeks. These instructions provide you with information on caring for yourself after your procedure. Your caregiver may also give you more specific instructions. Your treatment has been planned according to current medical practices, but problems sometimes occur. Call your caregiver if you have any problems or questions after your procedure.  °HOME CARE INSTRUCTIONS °· Do not eat or drink anything until the numbing medicine (local anesthetic) has worn off and your gag reflex has returned. You will know that the local anesthetic has worn off when you can swallow comfortably. °· Do not drive for 12 hours after the procedure or as directed by your caregiver. °· Only take medicines as directed by your caregiver. °SEEK MEDICAL CARE IF:  °· You cannot stop coughing. °· You are not urinating at all or less than usual. °SEEK IMMEDIATE MEDICAL CARE IF: °· You have difficulty swallowing. °· You cannot eat or drink. °·   You have worsening throat or chest pain. °· You have dizziness, lightheadedness, or you faint. °· You have nausea or vomiting. °· You have chills. °· You have a fever. °· You have severe abdominal pain. °· You have black, tarry, or bloody stools. °Document Released: 04/09/2012 Document Reviewed: 04/09/2012 °ExitCare® Patient  Information ©2015 [employer]. This information is not intended to replace advice given to you by your health care provider. Make sure you discuss any questions you have with your health care provider. ° °

## 2014-01-04 ENCOUNTER — Encounter (HOSPITAL_COMMUNITY): Payer: Self-pay | Admitting: Gastroenterology

## 2014-01-08 ENCOUNTER — Encounter: Payer: Self-pay | Admitting: *Deleted

## 2014-01-08 ENCOUNTER — Telehealth: Payer: Self-pay | Admitting: Oncology

## 2014-01-08 NOTE — Telephone Encounter (Signed)
new pt appt request sent by Elizebeth Koller per 9/4 pof rerouted to HIM and copied to beth.

## 2014-01-08 NOTE — [facility] Oncology Navigator Note (Signed)
Referral received from Dr. Benson Norway.  Dr. Benay Spice and Dr. Barry Dienes notified via in basket message.  Request to scheduler for appointment with Dr. Benay Spice was sent.

## 2014-01-12 ENCOUNTER — Other Ambulatory Visit: Payer: Self-pay | Admitting: Gastroenterology

## 2014-01-13 ENCOUNTER — Encounter (HOSPITAL_COMMUNITY): Payer: Self-pay | Admitting: Pharmacy Technician

## 2014-01-13 ENCOUNTER — Telehealth: Payer: Self-pay | Admitting: Oncology

## 2014-01-13 NOTE — Telephone Encounter (Signed)
C/D 01/13/14 for appt. 01/22/14

## 2014-01-15 ENCOUNTER — Encounter (HOSPITAL_COMMUNITY): Payer: Self-pay | Admitting: *Deleted

## 2014-01-15 ENCOUNTER — Encounter (HOSPITAL_COMMUNITY): Admission: RE | Payer: Self-pay | Source: Ambulatory Visit

## 2014-01-15 ENCOUNTER — Ambulatory Visit (HOSPITAL_COMMUNITY): Admission: RE | Admit: 2014-01-15 | Payer: Medicare Other | Source: Ambulatory Visit | Admitting: Gastroenterology

## 2014-01-15 SURGERY — ERCP, WITH INTERVENTION IF INDICATED
Anesthesia: Monitor Anesthesia Care

## 2014-01-15 SURGERY — ERCP, WITH INTERVENTION IF INDICATED
Anesthesia: General

## 2014-01-21 ENCOUNTER — Other Ambulatory Visit (INDEPENDENT_AMBULATORY_CARE_PROVIDER_SITE_OTHER): Payer: Self-pay | Admitting: General Surgery

## 2014-01-21 NOTE — H&P (Signed)
Peter Summers 01/21/2014 9:52 AM Location: [facility] Patient #: [phone] DOB: [birth year] Married / Language: English / Race: Black or African American Male History of Present Illness Peter Klein MD; 01/21/2014 10:47 AM) Patient words: mass on his pancreas.  The patient is a 73 year old male who presents with a pancreatic mass. The patient was referred by a gastroenterologist (Drs. Collene Mares and [place]). The mass was discovered by a specialist care provider. The mass was found 3 week(s) ago. The mass is located in the uncinate process. Initial presentation included abdominal pain. Past evaluation has included abdominal ultrasonography (endoscopic ultrasound), abdominal CT, abdominal MRI and gastroenterology evaluation. Past treatment has included opioid analgesics (percocet 1 tab q4h). Symptoms include abdominal pain, diabetes, diarrhea, early satiety and weight loss (10-12 pounds). The pain is located in the epigastric region. There is no radiation. The patient describes the pain as dull. Onset was sudden (came on 2 days after colonoscopy) 3 week(s) ago. The patient describes this as severe and unchanged. Symptoms are exacerbated by eating. Symptoms are relieved by analgesics. Current treatment includes opioid analgesics and gastroenterologist care. By report there is good compliance with treatment. The patient has a surgical history of appendectomy. pt had EUS and biopsy which was positive for neoplasm, but they could not tell if it was neuroendocrine or acinar cell carcinoma.  Other Problems Peter Summers, [place]; 01/21/2014 10:04 AM) Anxiety Disorder Bladder Problems Depression Diabetes Mellitus Enlarged Prostate High blood pressure Pancreatic Cancer Pancreatitis Umbilical Hernia Repair  Past Surgical History Peter Summers [place], Summers; 01/21/2014 10:04 AM) Appendectomy Colon Polyp Removal - Colonoscopy Shoulder Surgery Right. Spinal Surgery - Lower  Back  Diagnostic Studies History (Peter Summers, [place]; 01/21/2014 10:04 AM) Colonoscopy within last year  Allergies (Summers, Peter; 01/21/2014 10:07 AM) No Known Drug Allergies 01/21/2014  Medication History (Peter Summers, Summers; 01/21/2014 10:13 AM) KlonoPIN (1MG  Tablet, Oral) Active. Ferrous Fumarate (325 (106 Fe)MG Tablet, Oral) Active. Proscar (5MG  Tablet, Oral) Active. Lisinopril-Hydrochlorothiazide (20-12.5MG  Tablet, Oral) Active. MetFORMIN HCl (500MG  Tablet, Oral) Active. Multiple Vitamin (Oral) Active. Oxycodone-Acetaminophen (5-325MG  Tablet, Oral) Active. Medications Reconciled  Social History Peter Summers [place], [place]; 01/21/2014 10:04 AM) Tobacco use Former smoker.  Family History ([place], [place]; 01/21/2014 10:04 AM) Diabetes Mellitus Family Members In General.     Review of Systems (Peter Summers; 01/21/2014 10:04 AM) General Present- Weight Loss. Not Present- Appetite Loss, Chills, Fatigue, Fever, Night Sweats and Weight Gain. Skin Not Present- Change in Wart/Mole, Dryness, Hives, Jaundice, New Lesions, Non-Healing Wounds, Rash and Ulcer. HEENT Present- Ringing in the Ears. Not Present- Earache, Hearing Loss, Hoarseness, Nose Bleed, Oral Ulcers, Seasonal Allergies, Sinus Pain, Sore Throat, Visual Disturbances, Wears glasses/contact lenses and Yellow Eyes. Respiratory Present- Snoring. Not Present- Bloody sputum, Chronic Cough, Difficulty Breathing and Wheezing. Gastrointestinal Present- Abdominal Pain, Bloating, Change in Bowel Habits, Chronic diarrhea and Excessive gas. Not Present- Bloody Stool, Constipation, Difficulty Swallowing, Gets full quickly at meals, Hemorrhoids, Indigestion, Nausea, Rectal Pain and Vomiting. Male Genitourinary Present- Frequency and Nocturia. Not Present- Blood in Urine, Change in Urinary Stream, Impotence, Painful Urination, Urgency and Urine Leakage. Psychiatric Present- Anxiety  and Depression. Not Present- Bipolar, Change in Sleep Pattern, Fearful and Frequent crying. Endocrine Present- New Diabetes. Not Present- Cold Intolerance, Excessive Hunger, Hair Changes, Heat Intolerance and Hot flashes.  Vitals (Peter Summers; 01/21/2014 10:07 AM) 01/21/2014 10:04 AM Weight: 186.6 lb Height: 73in Body Surface Area: 2.09 m Body Mass Index: 24.62 kg/m Temp.: 66F(Oral)  Pulse: 98 (Regular)  P.OX: 98% (Room  air) BP: 122/68 (Sitting, Right Arm, Standard)     Physical Exam Peter Klein MD; 01/21/2014 10:49 AM)  General Mental Status-Alert. General Appearance-Consistent with stated age. Hydration-Well hydrated. Voice-Normal.  Head and Neck Head-normocephalic, atraumatic with no lesions or palpable masses. Trachea-midline. Thyroid Gland Characteristics - normal size and consistency. Note: Long hair covered by hat, spiritual.   Eye Eyeball - Bilateral-Extraocular movements intact. Sclera/Conjunctiva - Bilateral-No scleral icterus.  Chest and Lung Exam Chest and lung exam reveals -quiet, even and easy respiratory effort with no use of accessory muscles and on auscultation, normal breath sounds, no adventitious sounds and normal vocal resonance. Inspection Chest Wall - Normal. Back - normal.  Cardiovascular Cardiovascular examination reveals -normal heart sounds, regular rate and rhythm with no murmurs and normal pedal pulses bilaterally.  Abdomen Inspection Skin - Scar - Epigastrium and Right Lower Quadrant. Hernias - Umbilical hernia - [id number](6.0 cm, sensitive). Palpation/Percussion Palpation and Percussion of the abdomen reveal - Soft, No Rebound tenderness, No Rigidity (guarding) and No hepatosplenomegaly. Tenderness - Epigastrium. Auscultation Auscultation of the abdomen reveals - Bowel sounds normal.  Neurologic Neurologic evaluation reveals -alert and oriented x 3 with no impairment of recent or  remote memory. Mental Status-Normal.  Musculoskeletal Normal Exam - Left-Upper Extremity Strength Normal and Lower Extremity Strength Normal. Normal Exam - Right-Upper Extremity Strength Normal and Lower Extremity Strength Normal.  Lymphatic Head & Neck  General Head & Neck Lymphatics: Bilateral - Description - Normal. Axillary  General Axillary Region: Bilateral - Description - Normal. Tenderness - Non Tender.    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 I would not do another biopsy, as I don't think it would affect treatment recommendations. We will plan whipple.  I discussed this patient with Dr. Benson Norway, and Dr. Bobby Rumpf.  I reviewed diagrams of anatomy with him and his daughter. I discussed the surgery with the patient including diagrams of anatomy. I discussed the potential for diagnostic laparoscopy. In the case of pancreatic cancer, if spread of the disease is found, we will abort the procedure and not proceed with resection. The rationale for this was discussed with the patient. There has not been data to support resection of Stage IV disease in terms of survival benefit.  We discussed possible complications including: Potential of aborting procedure if tumor is invading the superior mesenteric or hepatic arteries Bleeding Infection and possible wound complications such as hernia Damage to adjacent structures Leak of anastamoses, primarily pancreatic Possible need for other procedures Possible prolonged hospital stay Possible development of diabetes or worsening of current diabetes. Possible pancreatic exocrine insufficiency Prolonged fatigue/weakness/appetite Difficulty with eating or post operative nausea Possible early recurrence of cancer   The patient understands and wishes to proceed. The patient has been advised to turn in disability  paperwork to our office.  45 min spent in consultation with >50% spent in counseling.  Current Plans CAT THORAX W/O DYE (10932) Schedule for Surgery Pt Education - FB whipple

## 2014-01-22 ENCOUNTER — Ambulatory Visit: Payer: Medicare Other

## 2014-01-22 ENCOUNTER — Ambulatory Visit: Payer: Medicare Other | Admitting: Oncology

## 2014-01-29 ENCOUNTER — Ambulatory Visit (HOSPITAL_COMMUNITY): Payer: Medicare Other

## 2014-01-29 ENCOUNTER — Encounter (HOSPITAL_COMMUNITY): Payer: Medicare Other | Admitting: Anesthesiology

## 2014-01-29 ENCOUNTER — Encounter (HOSPITAL_COMMUNITY)
Admission: RE | Disposition: A | Discharge status: Home / Self Care | Payer: Self-pay | Source: Ambulatory Visit | Attending: Gastroenterology

## 2014-01-29 ENCOUNTER — Ambulatory Visit (HOSPITAL_COMMUNITY)
Admission: RE | Admit: 2014-01-29 | Discharge: 2014-01-29 | Disposition: A | Discharge status: Home / Self Care | Payer: Medicare Other | Source: Ambulatory Visit | Attending: Gastroenterology | Admitting: Gastroenterology

## 2014-01-29 ENCOUNTER — Encounter (HOSPITAL_COMMUNITY): Payer: Self-pay | Admitting: *Deleted

## 2014-01-29 ENCOUNTER — Ambulatory Visit (HOSPITAL_COMMUNITY): Payer: Medicare Other | Admitting: Anesthesiology

## 2014-01-29 DIAGNOSIS — Z87891 Personal history of nicotine dependence: Secondary | ICD-10-CM | POA: Diagnosis not present

## 2014-01-29 DIAGNOSIS — F3289 Other specified depressive episodes: Secondary | ICD-10-CM | POA: Insufficient documentation

## 2014-01-29 DIAGNOSIS — F411 Generalized anxiety disorder: Secondary | ICD-10-CM | POA: Insufficient documentation

## 2014-01-29 DIAGNOSIS — F329 Major depressive disorder, single episode, unspecified: Secondary | ICD-10-CM | POA: Diagnosis not present

## 2014-01-29 DIAGNOSIS — K869 Disease of pancreas, unspecified: Secondary | ICD-10-CM | POA: Insufficient documentation

## 2014-01-29 DIAGNOSIS — N400 Enlarged prostate without lower urinary tract symptoms: Secondary | ICD-10-CM | POA: Diagnosis not present

## 2014-01-29 DIAGNOSIS — E119 Type 2 diabetes mellitus without complications: Secondary | ICD-10-CM | POA: Diagnosis not present

## 2014-01-29 DIAGNOSIS — I10 Essential (primary) hypertension: Secondary | ICD-10-CM | POA: Insufficient documentation

## 2014-01-29 DIAGNOSIS — Z79899 Other long term (current) drug therapy: Secondary | ICD-10-CM | POA: Diagnosis not present

## 2014-01-29 HISTORY — PX: ERCP: SHX5425

## 2014-01-29 HISTORY — PX: PANCREATIC STENT PLACEMENT: SHX5539

## 2014-01-29 LAB — GLUCOSE, CAPILLARY: Glucose-Capillary: 143 mg/dL — ABNORMAL HIGH (ref 70–99)

## 2014-01-29 SURGERY — ERCP, WITH INTERVENTION IF INDICATED
Anesthesia: General

## 2014-01-29 MED ORDER — PHENYLEPHRINE 40 MCG/ML (10ML) SYRINGE FOR IV PUSH (FOR BLOOD PRESSURE SUPPORT)
PREFILLED_SYRINGE | INTRAVENOUS | Status: AC
  Filled 2014-01-29: qty 10

## 2014-01-29 MED ORDER — PHENYLEPHRINE HCL 10 MG/ML IJ SOLN
INTRAMUSCULAR | Status: DC | PRN
  Administered 2014-01-29 (×5): 80 ug via INTRAVENOUS

## 2014-01-29 MED ORDER — FENTANYL CITRATE 0.05 MG/ML IJ SOLN
INTRAMUSCULAR | Status: DC | PRN
  Administered 2014-01-29: 100 ug via INTRAVENOUS

## 2014-01-29 MED ORDER — FENTANYL CITRATE 0.05 MG/ML IJ SOLN
25.0000 ug | INTRAMUSCULAR | Status: DC | PRN
  Administered 2014-01-29: 25 ug via INTRAVENOUS

## 2014-01-29 MED ORDER — CEFAZOLIN SODIUM 1-5 GM-% IV SOLN: 1.0000 g | Freq: Once | INTRAVENOUS | Status: DC

## 2014-01-29 MED ORDER — SODIUM CHLORIDE 0.9 % IV SOLN: INTRAVENOUS | Status: DC

## 2014-01-29 MED ORDER — SODIUM CHLORIDE 0.9 % IJ SOLN
INTRAMUSCULAR | Status: AC
  Filled 2014-01-29: qty 10

## 2014-01-29 MED ORDER — GLUCAGON HCL RDNA (DIAGNOSTIC) 1 MG IJ SOLR
INTRAMUSCULAR | Status: AC
Start: 2014-01-29 — End: 2014-01-29
  Filled 2014-01-29: qty 1

## 2014-01-29 MED ORDER — GLUCAGON HCL RDNA (DIAGNOSTIC) 1 MG IJ SOLR
INTRAMUSCULAR | Status: AC
  Filled 2014-01-29: qty 2

## 2014-01-29 MED ORDER — LACTATED RINGERS IV SOLN
INTRAVENOUS | Status: DC
  Administered 2014-01-29: 1000 mL via INTRAVENOUS
  Administered 2014-01-29: 12:00:00 via INTRAVENOUS

## 2014-01-29 MED ORDER — EPHEDRINE SULFATE 50 MG/ML IJ SOLN
INTRAMUSCULAR | Status: DC | PRN
  Administered 2014-01-29: 10 mg via INTRAVENOUS
  Administered 2014-01-29: 5 mg via INTRAVENOUS

## 2014-01-29 MED ORDER — SUCCINYLCHOLINE CHLORIDE 20 MG/ML IJ SOLN
INTRAMUSCULAR | Status: DC | PRN
  Administered 2014-01-29: 100 mg via INTRAVENOUS

## 2014-01-29 MED ORDER — FENTANYL CITRATE 0.05 MG/ML IJ SOLN
INTRAMUSCULAR | Status: AC
  Filled 2014-01-29: qty 2

## 2014-01-29 MED ORDER — ONDANSETRON HCL 4 MG/2ML IJ SOLN
INTRAMUSCULAR | Status: AC
  Filled 2014-01-29: qty 2

## 2014-01-29 MED ORDER — PROPOFOL 10 MG/ML IV BOLUS
INTRAVENOUS | Status: AC
  Filled 2014-01-29: qty 20

## 2014-01-29 MED ORDER — IOHEXOL 350 MG/ML SOLN
INTRAVENOUS | Status: DC | PRN
  Administered 2014-01-29: 13:00:00

## 2014-01-29 MED ORDER — ONDANSETRON HCL 4 MG/2ML IJ SOLN
INTRAMUSCULAR | Status: DC | PRN
  Administered 2014-01-29: 4 mg via INTRAVENOUS

## 2014-01-29 MED ORDER — EPHEDRINE SULFATE 50 MG/ML IJ SOLN
INTRAMUSCULAR | Status: AC
  Filled 2014-01-29: qty 1

## 2014-01-29 MED ORDER — SODIUM CHLORIDE 0.9 % IV SOLN
1.5000 g | Freq: Once | INTRAVENOUS | Status: AC
  Administered 2014-01-29: 1.5 g via INTRAVENOUS
  Filled 2014-01-29: qty 1.5

## 2014-01-29 MED ORDER — PROPOFOL 10 MG/ML IV BOLUS
INTRAVENOUS | Status: DC | PRN
  Administered 2014-01-29: 50 mg via INTRAVENOUS
  Administered 2014-01-29: 150 mg via INTRAVENOUS

## 2014-01-29 MED ORDER — GLUCAGON HCL RDNA (DIAGNOSTIC) 1 MG IJ SOLR
INTRAMUSCULAR | Status: DC | PRN
  Administered 2014-01-29: .5 mg via INTRAVENOUS
  Administered 2014-01-29: 1 mg via INTRAVENOUS
  Administered 2014-01-29: .5 mg via INTRAVENOUS

## 2014-01-29 NOTE — H&P (View-Only) (Signed)
Peter Summers 01/21/2014 9:52 AM Location: [facility] Patient #: [phone] DOB: 1940/10/18 Married / Language: English / Race: Black or African American Male History of Present Illness [name] MD; 01/21/2014 10:47 AM) Patient words: mass on his pancreas.  The patient is a 73 year old male who presents with a pancreatic mass. The patient was referred by a gastroenterologist (Drs. Collene Mares and [place]). The mass was discovered by a specialist care provider. The mass was found 3 week(s) ago. The mass is located in the uncinate process. Initial presentation included abdominal pain. Past evaluation has included abdominal ultrasonography (endoscopic ultrasound), abdominal CT, abdominal MRI and gastroenterology evaluation. Past treatment has included opioid analgesics (percocet 1 tab q4h). Symptoms include abdominal pain, diabetes, diarrhea, early satiety and weight loss (10-12 pounds). The pain is located in the epigastric region. There is no radiation. The patient describes the pain as dull. Onset was sudden (came on 2 days after colonoscopy) 3 week(s) ago. The patient describes this as severe and unchanged. Symptoms are exacerbated by eating. Symptoms are relieved by analgesics. Current treatment includes opioid analgesics and gastroenterologist care. By report there is good compliance with treatment. The patient has a surgical history of appendectomy. pt had EUS and biopsy which was positive for neoplasm, but they could not tell if it was neuroendocrine or acinar cell carcinoma.  Other Problems [facility] [place], [place]; 01/21/2014 10:04 AM) Anxiety Disorder Bladder Problems Depression Diabetes Mellitus Enlarged Prostate High blood pressure Pancreatic Cancer Pancreatitis Umbilical Hernia Repair  Past Surgical History Quantrell Bergeron [place], RMA; 01/21/2014 10:04 AM) Appendectomy Colon Polyp Removal - Colonoscopy Shoulder Surgery Right. Spinal Surgery - Lower  Back  Diagnostic Studies History ([place], [place]; 01/21/2014 10:04 AM) Colonoscopy within last year  Allergies ([place], [place]; 01/21/2014 10:07 AM) No Known Drug Allergies 01/21/2014  Medication History ([place], RMA; 01/21/2014 10:13 AM) KlonoPIN (1MG  Tablet, Oral) Active. Ferrous Fumarate (325 (106 Fe)MG Tablet, Oral) Active. Proscar (5MG  Tablet, Oral) Active. Lisinopril-Hydrochlorothiazide (20-12.5MG  Tablet, Oral) Active. MetFORMIN HCl (500MG  Tablet, Oral) Active. Multiple Vitamin (Oral) Active. Oxycodone-Acetaminophen (5-325MG  Tablet, Oral) Active. Medications Reconciled  Social History Brainard Bergeron [place], [place]; 01/21/2014 10:04 AM) Tobacco use Former smoker.  Family History ([place], [place]; 01/21/2014 10:04 AM) Diabetes Mellitus Family Members In General.     Review of Systems ([place] RMA; 01/21/2014 10:04 AM) General Present- Weight Loss. Not Present- Appetite Loss, Chills, Fatigue, Fever, Night Sweats and Weight Gain. Skin Not Present- Change in Wart/Mole, Dryness, Hives, Jaundice, New Lesions, Non-Healing Wounds, Rash and Ulcer. HEENT Present- Ringing in the Ears. Not Present- Earache, Hearing Loss, Hoarseness, Nose Bleed, Oral Ulcers, Seasonal Allergies, Sinus Pain, Sore Throat, Visual Disturbances, Wears glasses/contact lenses and Yellow Eyes. Respiratory Present- Snoring. Not Present- Bloody sputum, Chronic Cough, Difficulty Breathing and Wheezing. Gastrointestinal Present- Abdominal Pain, Bloating, Change in Bowel Habits, Chronic diarrhea and Excessive gas. Not Present- Bloody Stool, Constipation, Difficulty Swallowing, Gets full quickly at meals, Hemorrhoids, Indigestion, Nausea, Rectal Pain and Vomiting. Male Genitourinary Present- Frequency and Nocturia. Not Present- Blood in Urine, Change in Urinary Stream, Impotence, Painful Urination, Urgency and Urine Leakage. Psychiatric Present- Anxiety  and Depression. Not Present- Bipolar, Change in Sleep Pattern, Fearful and Frequent crying. Endocrine Present- New Diabetes. Not Present- Cold Intolerance, Excessive Hunger, Hair Changes, Heat Intolerance and Hot flashes.  Vitals (D[place]ah [place] RMA; 01/21/2014 10:07 AM) 01/21/2014 10:04 AM Weight: 186.6 lb Height: 73in Body Surface Area: 2.09 m Body Mass Index: 24.62 kg/m Temp.: 30F(Oral)  Pulse: 98 (Regular)  P.OX: 98% (Room  air) BP: 122/68 (Sitting, Right Arm, Standard)     Physical Exam [name] MD; 01/21/2014 10:49 AM)  General Mental Status-Alert. General Appearance-Consistent with stated age. Hydration-Well hydrated. Voice-Normal.  Head and Neck Head-normocephalic, atraumatic with no lesions or palpable masses. Trachea-midline. Thyroid Gland Characteristics - normal size and consistency. Note: Long hair covered by hat, spiritual.   Eye Eyeball - Bilateral-Extraocular movements intact. Sclera/Conjunctiva - Bilateral-No scleral icterus.  Chest and Lung Exam Chest and lung exam reveals -quiet, even and easy respiratory effort with no use of accessory muscles and on auscultation, normal breath sounds, no adventitious sounds and normal vocal resonance. Inspection Chest Wall - Normal. Back - normal.  Cardiovascular Cardiovascular examination reveals -normal heart sounds, regular rate and rhythm with no murmurs and normal pedal pulses bilaterally.  Abdomen Inspection Skin - Scar - Epigastrium and Right Lower Quadrant. Hernias - Umbilical hernia - [id number](6.9 cm, sensitive). Palpation/Percussion Palpation and Percussion of the abdomen reveal - Soft, No Rebound tenderness, No Rigidity (guarding) and No hepatosplenomegaly. Tenderness - Epigastrium. Auscultation Auscultation of the abdomen reveals - Bowel sounds normal.  Neurologic Neurologic evaluation reveals -alert and oriented x 3 with no impairment of recent or  remote memory. Mental Status-Normal.  Musculoskeletal Normal Exam - Left-Upper Extremity Strength Normal and Lower Extremity Strength Normal. Normal Exam - Right-Upper Extremity Strength Normal and Lower Extremity Strength Normal.  Lymphatic Head & Neck  General Head & Neck Lymphatics: Bilateral - Description - Normal. Axillary  General Axillary Region: Bilateral - Description - Normal. Tenderness - Non Tender.    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 I would not do another biopsy, as I don't think it would affect treatment recommendations. We will plan whipple.  I discussed this patient with Dr. Benson Norway, and Dr. Bobby Rumpf.  I reviewed diagrams of anatomy with him and his daughter. I discussed the surgery with the patient including diagrams of anatomy. I discussed the potential for diagnostic laparoscopy. In the case of pancreatic cancer, if spread of the disease is found, we will abort the procedure and not proceed with resection. The rationale for this was discussed with the patient. There has not been data to support resection of Stage IV disease in terms of survival benefit.  We discussed possible complications including: Potential of aborting procedure if tumor is invading the superior mesenteric or hepatic arteries Bleeding Infection and possible wound complications such as hernia Damage to adjacent structures Leak of anastamoses, primarily pancreatic Possible need for other procedures Possible prolonged hospital stay Possible development of diabetes or worsening of current diabetes. Possible pancreatic exocrine insufficiency Prolonged fatigue/weakness/appetite Difficulty with eating or post operative nausea Possible early recurrence of cancer   The patient understands and wishes to proceed. The patient has been advised to turn in disability  paperwork to our office.  45 min spent in consultation with >50% spent in counseling.  Current Plans CAT THORAX W/O DYE (67893) Schedule for Surgery Pt Education - FB whipple

## 2014-01-29 NOTE — Transfer of Care (Signed)
Immediate Anesthesia Transfer of Care Note  Patient: Peter Summers  Procedure(s) Performed: Procedure(s) (LRB): ENDOSCOPIC RETROGRADE CHOLANGIOPANCREATOGRAPHY (ERCP) (N/A) PANCREATIC STENT PLACEMENT (N/A)  Patient Location: PACU  Anesthesia Type: General  Level of Consciousness: sedated, patient cooperative and responds to stimulation  Airway & Oxygen Therapy: Patient Spontanous Breathing and Patient connected to face mask oxgen  Post-op Assessment: Report given to PACU RN and Post -op Vital signs reviewed and stable  Post vital signs: Reviewed and stable  Complications: No apparent anesthesia complications

## 2014-01-29 NOTE — Interval H&P Note (Signed)
History and Physical Interval Note:  01/29/2014 8:03 AM  Peter Summers  has presented today for surgery, with the diagnosis of pancreatic ca  The various methods of treatment have been discussed with the patient and family. After consideration of risks, benefits and other options for treatment, the patient has consented to  Procedure(s): ENDOSCOPIC RETROGRADE CHOLANGIOPANCREATOGRAPHY (ERCP) (N/A) PANCREATIC STENT PLACEMENT (N/A) as a surgical intervention .  The patient's history has been reviewed, patient examined, no change in status, stable for surgery.  I have reviewed the patient's chart and labs.  Questions were answered to the patient's satisfaction.     Trevar Boehringer D

## 2014-01-29 NOTE — Op Note (Signed)
[facility] [place], [id number]   ERCP PROCEDURE REPORT        EXAM DATE: 01/29/2014  PATIENT NAME:          Peter Summers, Peter Summers          MR #: [id number] BIRTHDATE:       [birth year]     VISIT #:     [phone] ATTENDING:     Carol Ada, MD     STATUS:     outpatient REFERRING MD:  INDICATIONS:  The patient is a 73 yr old male here for an ERCP due to Pancreatic head mass. PROCEDURE PERFORMED:     ERCP with stent placement MEDICATIONS:     Monitored anesthesia care  CONSENT: The patient understands the risks and benefits of the procedure and understands that these risks include, but are not limited to: sedation, allergic reaction, infection, perforation and/or bleeding. Alternative means of evaluation and treatment include, among others: physical exam, x-rays, and/or surgical intervention. The patient elects to proceed with this endoscopic procedure.  DESCRIPTION OF PROCEDURE: During intra-op preparation period all mechanical & medical equipment was checked for proper function. Hand hygiene and appropriate measures for infection prevention was taken. After the risks, benefits and alternatives of the procedure were thoroughly explained, Informed was verified, confirmed and timeout was successfully executed by the treatment team. With the patient in left semi-prone position, medications were administered intravenously.  Every aspect of the case was EXTREMELY difficult.  The    was passed from the mouth into the esophagus and further advanced from the esophagus into the stomach. In the gastric lumen there was a significant amount of residual gastric contents and the patient had a J-shaped stomach.  With aspiration the gastric contents were decreased.  From the stomach the scope was directed to the second portion of the duodenum.  Major papilla was aligned with the duodenoscope, but this was a very difficult process.  The ampulla was  obscured in the duodenal folds.  With persistence the orifice was identified.  Subsequently cannulation was performed.  Optimal positioning was extremely difficult, but ultimately the guidewire was able to cannulate the CBD and be secured in the right intrahepatic ducts.  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  A 1-1.5 cm sphincterotomy was created.  A 10 mm x 60 mm covered metallic stent was selected and inserted into the CBD.  Insertion was difficult and it required two attempts.  Excellent positioning of the stent was obtained and fluoroscopic images were obtained confirming the positioning.  The distal end of the stent was approximatly 2-2.5 cm into the duodenal lumen and it did not touch the opposite wall. Removal of the deploying mechanism was meet with resistance.  It appeared that it was stuck in the strictured portion of the CBD, but with moderate pressure the mechanism was removed.  At this point the procedure was terminated.  The ampulla was located.    ADVERSE EVENT:     None  IMPRESSIONS:     Successful 10 mm x 60 mm covered metallic stent placement.  RECOMMENDATIONS:     1) Whipple's procedure per Dr. Barry Dienes.   REPEAT EXAM:   ___________________________________ Carol Ada, MD eSigned:  Carol Ada, MD 01/29/2014 12:11 PM   cc:     PATIENT NAME:  Peter Summers, Peter Summers MR#: [id number]

## 2014-01-29 NOTE — Discharge Instructions (Signed)
Endoscopic Retrograde Cholangiopancreatography (ERCP), Care After °Refer to this sheet in the next few weeks. These instructions provide you with information on caring for yourself after your procedure. Your health care provider may also give you more specific instructions. Your treatment has been planned according to current medical practices, but problems sometimes occur. Call your health care provider if you have any problems or questions after your procedure.  °WHAT TO EXPECT AFTER THE PROCEDURE  °After your procedure, it is typical to feel:  °· Soreness in your throat.   °· Sick to your stomach (nauseous).   °· Bloated. °· Dizzy.   °· Fatigued. °HOME CARE INSTRUCTIONS °· Have a friend or family member stay with you for the first 24 hours after your procedure. °· Start taking your usual medicines and eating normally as soon as you feel well enough to do so or as directed by your health care provider. °SEEK MEDICAL CARE IF: °· You have abdominal pain.   °· You develop signs of infection, such as:   °· Chills.   °· Feeling unwell.   °SEEK IMMEDIATE MEDICAL CARE IF: °· You have difficulty swallowing. °· You have worsening throat, chest, or abdominal pain. °· You vomit. °· You have bloody or very black stools. °· You have a fever. °Document Released: 02/11/2013 Document Reviewed: 02/11/2013 °ExitCare® Patient Information ©2015 [employer]. This information is not intended to replace advice given to you by your health care provider. Make sure you discuss any questions you have with your health care provider. ° ° ° °General Anesthesia, Care After °Refer to this sheet in the next few weeks. These instructions provide you with information on caring for yourself after your procedure. Your health care provider may also give you more specific instructions. Your treatment has been planned according to current medical practices, but problems sometimes occur. Call your health care provider if you have any problems or  questions after your procedure. °WHAT TO EXPECT AFTER THE PROCEDURE °After the procedure, it is typical to experience: °· Sleepiness. °· Nausea and vomiting. °HOME CARE INSTRUCTIONS °· For the first 24 hours after general anesthesia: °¨ Have a responsible person with you. °¨ Do not drive a car. If you are alone, do not take public transportation. °¨ Do not drink alcohol. °¨ Do not take medicine that has not been prescribed by your health care provider. °¨ Do not sign important papers or make important decisions. °¨ You may resume a normal diet and activities as directed by your health care provider. °· Change bandages (dressings) as directed. °· If you have questions or problems that seem related to general anesthesia, call the hospital and ask for the anesthetist or anesthesiologist on call. °SEEK MEDICAL CARE IF: °· You have nausea and vomiting that continue the day after anesthesia. °· You develop a rash. °SEEK IMMEDIATE MEDICAL CARE IF:  °· You have difficulty breathing. °· You have chest pain. °· You have any allergic problems. °Document Released: 07/30/2000 Document Revised: 04/28/2013 Document Reviewed: 11/06/2012 °ExitCare® Patient Information ©2015 [employer]. This information is not intended to replace advice given to you by your health care provider. Make sure you discuss any questions you have with your health care provider. ° °

## 2014-01-29 NOTE — Anesthesia Postprocedure Evaluation (Signed)
  Anesthesia Post-op Note  Patient: Peter Summers  Procedure(s) Performed: Procedure(s) (LRB): ENDOSCOPIC RETROGRADE CHOLANGIOPANCREATOGRAPHY (ERCP) (N/A) PANCREATIC STENT PLACEMENT (N/A)  Patient Location: PACU  Anesthesia Type: General  Level of Consciousness: awake and alert   Airway and Oxygen Therapy: Patient Spontanous Breathing  Post-op Pain: mild  Post-op Assessment: Post-op Vital signs reviewed, Patient's Cardiovascular Status Stable, Respiratory Function Stable, Patent Airway and No signs of Nausea or vomiting  Last Vitals:  Filed Vitals:   01/29/14 1320  BP: 144/67  Pulse: 99  Temp:   Resp: 18    Post-op Vital Signs: stable   Complications: No apparent anesthesia complications

## 2014-01-29 NOTE — Anesthesia Preprocedure Evaluation (Addendum)
Anesthesia Evaluation  Patient identified by MRN, date of birth, ID band Patient awake    Reviewed: Allergy & Precautions, H&P , NPO status , Patient's Chart, lab work & pertinent test results  Airway Mallampati: II TM Distance: >3 FB Neck ROM: full    Dental no notable dental hx. (+) Teeth Intact, Dental Advisory Given   Pulmonary neg pulmonary ROS,  breath sounds clear to auscultation  Pulmonary exam normal       Cardiovascular Exercise Tolerance: Good hypertension, Pt. on medications Rhythm:regular Rate:Normal  RBBB   Neuro/Psych Anxiety negative neurological ROS  negative psych ROS   GI/Hepatic negative GI ROS, Neg liver ROS,   Endo/Other  diabetes, Well Controlled, Type 2, Oral Hypoglycemic Agents  Renal/GU negative Renal ROS  negative genitourinary   Musculoskeletal   Abdominal   Peds  Hematology negative hematology ROS (+)   Anesthesia Other Findings   Reproductive/Obstetrics negative OB ROS                         Anesthesia Physical Anesthesia Plan  ASA: III  Anesthesia Plan: General   Post-op Pain Management:    Induction: Intravenous  Airway Management Planned: Oral ETT  Additional Equipment:   Intra-op Plan:   Post-operative Plan:   Informed Consent: I have reviewed the patients History and Physical, chart, labs and discussed the procedure including the risks, benefits and alternatives for the proposed anesthesia with the patient or authorized representative who has indicated his/her understanding and acceptance.   Dental Advisory Given  Plan Discussed with: CRNA and Surgeon  Anesthesia Plan Comments:        Anesthesia Quick Evaluation

## 2014-02-01 ENCOUNTER — Encounter (HOSPITAL_COMMUNITY): Payer: Self-pay | Admitting: Gastroenterology

## 2014-02-19 ENCOUNTER — Encounter (HOSPITAL_COMMUNITY): Payer: Self-pay | Admitting: Pharmacy Technician

## 2014-03-01 ENCOUNTER — Telehealth (INDEPENDENT_AMBULATORY_CARE_PROVIDER_SITE_OTHER): Payer: Self-pay | Admitting: General Surgery

## 2014-03-01 ENCOUNTER — Encounter (HOSPITAL_COMMUNITY): Payer: Self-pay

## 2014-03-01 ENCOUNTER — Encounter (INDEPENDENT_AMBULATORY_CARE_PROVIDER_SITE_OTHER): Payer: Self-pay

## 2014-03-01 ENCOUNTER — Encounter (HOSPITAL_COMMUNITY)
Admission: RE | Admit: 2014-03-01 | Discharge: 2014-03-01 | Disposition: A | Discharge status: Home / Self Care | Payer: Medicare Other | Source: Ambulatory Visit | Attending: General Surgery | Admitting: General Surgery

## 2014-03-01 HISTORY — DX: Malignant (primary) neoplasm, unspecified: C80.1

## 2014-03-01 LAB — COMPREHENSIVE METABOLIC PANEL
ALT: 21 U/L (ref 0–53)
AST: 13 U/L (ref 0–37)
Albumin: 3.7 g/dL (ref 3.5–5.2)
Alkaline Phosphatase: 118 U/L — ABNORMAL HIGH (ref 39–117)
Anion gap: 12 (ref 5–15)
BUN: 19 mg/dL (ref 6–23)
CO2: 27 mEq/L (ref 19–32)
Calcium: 9.8 mg/dL (ref 8.4–10.5)
Chloride: 94 mEq/L — ABNORMAL LOW (ref 96–112)
Creatinine, Ser: 0.92 mg/dL (ref 0.50–1.35)
GFR calc Af Amer: >90 mL/min (ref >90)
GFR calc non Af Amer: 82 mL/min — ABNORMAL LOW (ref >90)
Glucose, Bld: 370 mg/dL — ABNORMAL HIGH (ref 70–99)
Potassium: 4.5 mEq/L (ref 3.7–5.3)
Sodium: 133 mEq/L — ABNORMAL LOW (ref 137–147)
Total Bilirubin: 0.5 mg/dL (ref 0.3–1.2)
Total Protein: 7.6 g/dL (ref 6.0–8.3)

## 2014-03-01 LAB — URINE MICROSCOPIC-ADD ON

## 2014-03-01 LAB — CBC WITH DIFFERENTIAL/PLATELET
Basophils Absolute: 0 10*3/uL (ref 0.0–0.1)
Basophils Relative: 0 % (ref 0–1)
Eosinophils Absolute: 0.2 10*3/uL (ref 0.0–0.7)
Eosinophils Relative: 3 % (ref 0–5)
HCT: 27.7 % — ABNORMAL LOW (ref 39.0–52.0)
Hemoglobin: 9.6 g/dL — ABNORMAL LOW (ref 13.0–17.0)
Lymphocytes Relative: 42 % (ref 12–46)
Lymphs Abs: 2.2 10*3/uL (ref 0.7–4.0)
MCH: 27.9 pg (ref 26.0–34.0)
MCHC: 34.7 g/dL (ref 30.0–36.0)
MCV: 80.5 fL (ref 78.0–100.0)
Monocytes Absolute: 0.3 10*3/uL (ref 0.1–1.0)
Monocytes Relative: 6 % (ref 3–12)
Neutro Abs: 2.6 10*3/uL (ref 1.7–7.7)
Neutrophils Relative %: 49 % (ref 43–77)
Platelets: 261 10*3/uL (ref 150–400)
RBC: 3.44 MIL/uL — ABNORMAL LOW (ref 4.22–5.81)
RDW: 15 % (ref 11.5–15.5)
WBC: 5.3 10*3/uL (ref 4.0–10.5)

## 2014-03-01 LAB — URINALYSIS, ROUTINE W REFLEX MICROSCOPIC
Bilirubin Urine: NEGATIVE
Glucose, UA: >1000 mg/dL — AB
Hgb urine dipstick: NEGATIVE
Ketones, ur: NEGATIVE mg/dL
Leukocytes, UA: NEGATIVE
Nitrite: NEGATIVE
Protein, ur: NEGATIVE mg/dL
Specific Gravity, Urine: 1.019 (ref 1.005–1.030)
Urobilinogen, UA: 0.2 mg/dL (ref 0.0–1.0)
pH: 5 (ref 5.0–8.0)

## 2014-03-01 LAB — PREPARE RBC (CROSSMATCH)

## 2014-03-01 LAB — PROTIME-INR
INR: 1.19 (ref 0.00–1.49)
Prothrombin Time: 15.2 seconds (ref 11.6–15.2)

## 2014-03-01 LAB — ABO/RH: ABO/RH(D): A POS

## 2014-03-01 LAB — APTT: aPTT: 33 seconds (ref 24–37)

## 2014-03-01 NOTE — Telephone Encounter (Signed)
Discussed Peter Summers's labs with Dr.Howell.   His glucose is 370.  They will get him in to discuss how to get that down by Thursday.

## 2014-03-01 NOTE — Patient Instructions (Addendum)
FLEETS ENEMA THE NIGHT BEFORE YOUR SURGERY.  YOUR SURGERY IS SCHEDULED AT [facility]  ON:  Thursday  10/29  REPORT TO  SHORT STAY CENTER AT:  5:30 AM   DO NOT EAT OR DRINK ANYTHING AFTER MIDNIGHT THE NIGHT BEFORE YOUR SURGERY.  YOU MAY BRUSH YOUR TEETH, RINSE OUT YOUR MOUTH--BUT NO WATER, NO FOOD, NO CHEWING GUM, NO MINTS, NO CANDIES, NO CHEWING TOBACCO.  PLEASE TAKE THE FOLLOWING MEDICATIONS THE AM OF YOUR SURGERY WITH A FEW SIPS OF WATER:  CLONAZEPAM, FINASTERIDE, OXYCODONE / ACETAMINOPHEN IF NEEDED FOR PAIN.   IF YOU ARE DIABETIC:  DO NOT TAKE ANY DIABETIC MEDICATIONS THE AM OF YOUR SURGERY.    DO NOT BRING VALUABLES, MONEY, CREDIT CARDS.  DO NOT WEAR JEWELRY, MAKE-UP, NAIL POLISH AND NO METAL PINS OR CLIPS IN YOUR HAIR. CONTACT LENS, DENTURES / PARTIALS, GLASSES SHOULD NOT BE WORN TO SURGERY AND IN MOST CASES-HEARING AIDS WILL NEED TO BE REMOVED.  BRING YOUR GLASSES CASE, ANY EQUIPMENT NEEDED FOR YOUR CONTACT LENS. FOR PATIENTS ADMITTED TO THE HOSPITAL--CHECK OUT TIME THE DAY OF DISCHARGE IS 11:00 AM.  ALL INPATIENT ROOMS ARE PRIVATE - WITH BATHROOM, TELEPHONE, TELEVISION AND WIFI INTERNET.    PLEASE BE AWARE THAT YOU MAY NEED ADDITIONAL BLOOD DRAWN DAY OF YOUR SURGERY  _______________________________________________________________________   [facility] - Preparing for Surgery Before surgery, you can play an important role.  Because skin is not sterile, your skin needs to be as free of germs as possible.  You can reduce the number of germs on your skin by washing with CHG (chlorahexidine gluconate) soap before surgery.  CHG is an antiseptic cleaner which kills germs and bonds with the skin to continue killing germs even after washing. Please DO NOT use if you have an allergy to CHG or antibacterial soaps.  If your skin becomes reddened/irritated stop using the CHG and inform your nurse when you arrive at Short Stay. Do not shave (including legs and underarms) for  at least 48 hours prior to the first CHG shower.  You may shave your face/neck. Please follow these instructions carefully:  1.  Shower with CHG Soap the night before surgery and the  morning of Surgery.  2.  If you choose to wash your hair, wash your hair first as usual with your  normal  shampoo.  3.  After you shampoo, rinse your hair and body thoroughly to remove the  shampoo.                           4.  Use CHG as you would any other liquid soap.  You can apply chg directly  to the skin and wash                       Gently with a scrungie or clean washcloth.  5.  Apply the CHG Soap to your body ONLY FROM THE NECK DOWN.   Do not use on face/ open                           Wound or open sores. Avoid contact with eyes, ears mouth and genitals (private parts).                       Wash face,  Genitals (private parts) with your normal soap.  6.  Wash thoroughly, paying special attention to the area where your surgery  will be performed.  7.  Thoroughly rinse your body with warm water from the neck down.  8.  DO NOT shower/wash with your normal soap after using and rinsing off  the CHG Soap.                9.  Pat yourself dry with a clean towel.            10.  Wear clean pajamas.            11.  Place clean sheets on your bed the night of your first shower and do not  sleep with pets. Day of Surgery : Do not apply any lotions/deodorants the morning of surgery.  Please wear clean clothes to the hospital/surgery center.  FAILURE TO FOLLOW THESE INSTRUCTIONS MAY RESULT IN THE CANCELLATION OF YOUR SURGERY PATIENT SIGNATURE_________________________________  NURSE SIGNATURE__________________________________

## 2014-03-01 NOTE — Pre-Procedure Instructions (Signed)
PT HAS EKG AND CXR REPORTS IN EPIC FROM 04-21-13.

## 2014-03-02 LAB — CANCER ANTIGEN 19-9: CA 19-9: 3.6 U/mL — ABNORMAL LOW (ref <35.0)

## 2014-03-02 NOTE — Pre-Procedure Instructions (Signed)
PT'S PREOP CBC, DIFF, CMET, CA 19-9, AND URINALYSIS REPORTS ROUTED TO DR. BYERLY'S EPIC IN BASKET FOR REVIEW.

## 2014-03-04 ENCOUNTER — Inpatient Hospital Stay (HOSPITAL_COMMUNITY): Payer: Medicare Other | Admitting: Anesthesiology

## 2014-03-04 ENCOUNTER — Encounter (HOSPITAL_COMMUNITY): Payer: Medicare Other | Admitting: Anesthesiology

## 2014-03-04 ENCOUNTER — Encounter (HOSPITAL_COMMUNITY)
Admission: RE | Disposition: A | Discharge status: Home / Self Care | Payer: Self-pay | Source: Ambulatory Visit | Attending: General Surgery

## 2014-03-04 ENCOUNTER — Encounter (HOSPITAL_COMMUNITY): Payer: Self-pay | Admitting: *Deleted

## 2014-03-04 ENCOUNTER — Inpatient Hospital Stay (HOSPITAL_COMMUNITY)
Admission: RE | Admit: 2014-03-04 | Discharge: 2014-03-15 | DRG: 406 | Disposition: A | Discharge status: Home / Self Care | Payer: Medicare Other | Source: Ambulatory Visit | Attending: General Surgery | Admitting: General Surgery

## 2014-03-04 DIAGNOSIS — I10 Essential (primary) hypertension: Secondary | ICD-10-CM | POA: Diagnosis present

## 2014-03-04 DIAGNOSIS — Z833 Family history of diabetes mellitus: Secondary | ICD-10-CM | POA: Diagnosis not present

## 2014-03-04 DIAGNOSIS — D62 Acute posthemorrhagic anemia: Secondary | ICD-10-CM | POA: Diagnosis not present

## 2014-03-04 DIAGNOSIS — N400 Enlarged prostate without lower urinary tract symptoms: Secondary | ICD-10-CM | POA: Diagnosis not present

## 2014-03-04 DIAGNOSIS — Z01812 Encounter for preprocedural laboratory examination: Secondary | ICD-10-CM | POA: Diagnosis not present

## 2014-03-04 DIAGNOSIS — Z86010 Personal history of colonic polyps: Secondary | ICD-10-CM

## 2014-03-04 DIAGNOSIS — E861 Hypovolemia: Secondary | ICD-10-CM | POA: Diagnosis present

## 2014-03-04 DIAGNOSIS — Z6824 Body mass index (BMI) 24.0-24.9, adult: Secondary | ICD-10-CM

## 2014-03-04 DIAGNOSIS — K861 Other chronic pancreatitis: Secondary | ICD-10-CM | POA: Diagnosis present

## 2014-03-04 DIAGNOSIS — E1165 Type 2 diabetes mellitus with hyperglycemia: Secondary | ICD-10-CM | POA: Diagnosis present

## 2014-03-04 DIAGNOSIS — K429 Umbilical hernia without obstruction or gangrene: Secondary | ICD-10-CM | POA: Diagnosis not present

## 2014-03-04 DIAGNOSIS — B999 Unspecified infectious disease: Secondary | ICD-10-CM | POA: Diagnosis not present

## 2014-03-04 DIAGNOSIS — E440 Moderate protein-calorie malnutrition: Secondary | ICD-10-CM | POA: Insufficient documentation

## 2014-03-04 DIAGNOSIS — Z87891 Personal history of nicotine dependence: Secondary | ICD-10-CM | POA: Diagnosis not present

## 2014-03-04 DIAGNOSIS — F419 Anxiety disorder, unspecified: Secondary | ICD-10-CM | POA: Diagnosis present

## 2014-03-04 DIAGNOSIS — C250 Malignant neoplasm of head of pancreas: Principal | ICD-10-CM | POA: Diagnosis present

## 2014-03-04 DIAGNOSIS — K869 Disease of pancreas, unspecified: Secondary | ICD-10-CM | POA: Diagnosis present

## 2014-03-04 DIAGNOSIS — Z9049 Acquired absence of other specified parts of digestive tract: Secondary | ICD-10-CM | POA: Diagnosis present

## 2014-03-04 DIAGNOSIS — T814XXA Infection following a procedure, initial encounter: Secondary | ICD-10-CM | POA: Diagnosis not present

## 2014-03-04 DIAGNOSIS — F329 Major depressive disorder, single episode, unspecified: Secondary | ICD-10-CM | POA: Diagnosis not present

## 2014-03-04 HISTORY — PX: UMBILICAL HERNIA REPAIR: SHX196

## 2014-03-04 HISTORY — PX: WHIPPLE PROCEDURE: SHX2667

## 2014-03-04 LAB — GLUCOSE, CAPILLARY
Glucose-Capillary: 172 mg/dL — ABNORMAL HIGH (ref 70–99)
Glucose-Capillary: 165 mg/dL — ABNORMAL HIGH (ref 70–99)
Glucose-Capillary: 213 mg/dL — ABNORMAL HIGH (ref 70–99)
Glucose-Capillary: 218 mg/dL — ABNORMAL HIGH (ref 70–99)
Glucose-Capillary: 228 mg/dL — ABNORMAL HIGH (ref 70–99)

## 2014-03-04 LAB — MRSA PCR SCREENING: MRSA by PCR: INVALID — AB

## 2014-03-04 LAB — CBC
HCT: 25.2 % — ABNORMAL LOW (ref 39.0–52.0)
HCT: 26.4 % — ABNORMAL LOW (ref 39.0–52.0)
Hemoglobin: 8.7 g/dL — ABNORMAL LOW (ref 13.0–17.0)
Hemoglobin: 9.1 g/dL — ABNORMAL LOW (ref 13.0–17.0)
MCH: 27.6 pg (ref 26.0–34.0)
MCH: 27.5 pg (ref 26.0–34.0)
MCHC: 34.5 g/dL (ref 30.0–36.0)
MCHC: 34.5 g/dL (ref 30.0–36.0)
MCV: 80 fL (ref 78.0–100.0)
MCV: 79.8 fL (ref 78.0–100.0)
Platelets: 253 10*3/uL (ref 150–400)
Platelets: 266 10*3/uL (ref 150–400)
RBC: 3.15 MIL/uL — ABNORMAL LOW (ref 4.22–5.81)
RBC: 3.31 MIL/uL — ABNORMAL LOW (ref 4.22–5.81)
RDW: 15.2 % (ref 11.5–15.5)
RDW: 15.1 % (ref 11.5–15.5)
WBC: 13.5 10*3/uL — ABNORMAL HIGH (ref 4.0–10.5)
WBC: 11.6 10*3/uL — ABNORMAL HIGH (ref 4.0–10.5)

## 2014-03-04 LAB — CREATININE, SERUM
Creatinine, Ser: 0.84 mg/dL (ref 0.50–1.35)
GFR calc Af Amer: >90 mL/min (ref >90)
GFR calc non Af Amer: 85 mL/min — ABNORMAL LOW (ref >90)

## 2014-03-04 SURGERY — WHIPPLE PROCEDURE
Anesthesia: General

## 2014-03-04 MED ORDER — LACTATED RINGERS IV SOLN
INTRAVENOUS | Status: DC | PRN
  Administered 2014-03-04 (×6): via INTRAVENOUS

## 2014-03-04 MED ORDER — SUCCINYLCHOLINE CHLORIDE 20 MG/ML IJ SOLN
INTRAMUSCULAR | Status: DC | PRN
  Administered 2014-03-04: 100 mg via INTRAVENOUS

## 2014-03-04 MED ORDER — METOPROLOL TARTRATE 1 MG/ML IV SOLN
INTRAVENOUS | Status: AC
  Filled 2014-03-04: qty 5

## 2014-03-04 MED ORDER — NEOSTIGMINE METHYLSULFATE 10 MG/10ML IV SOLN
INTRAVENOUS | Status: DC | PRN
  Administered 2014-03-04: 4 mg via INTRAVENOUS

## 2014-03-04 MED ORDER — VITAMINS A & D EX OINT
TOPICAL_OINTMENT | CUTANEOUS | Status: AC
  Administered 2014-03-04: 1
  Filled 2014-03-04: qty 5

## 2014-03-04 MED ORDER — HYDROMORPHONE HCL 2 MG/ML IJ SOLN
INTRAMUSCULAR | Status: AC
  Filled 2014-03-04: qty 1

## 2014-03-04 MED ORDER — ONDANSETRON HCL 4 MG PO TABS: 4.0000 mg | ORAL_TABLET | Freq: Four times a day (QID) | ORAL | Status: DC | PRN

## 2014-03-04 MED ORDER — PHENYLEPHRINE HCL 10 MG/ML IJ SOLN
10.0000 mg | INTRAMUSCULAR | Status: DC | PRN
  Administered 2014-03-04: 10 ug/min via INTRAVENOUS

## 2014-03-04 MED ORDER — ROCURONIUM BROMIDE 100 MG/10ML IV SOLN
INTRAVENOUS | Status: AC
  Filled 2014-03-04: qty 1

## 2014-03-04 MED ORDER — DEXTROSE 5 % IV SOLN
1.0000 g | Freq: Four times a day (QID) | INTRAVENOUS | Status: AC
  Administered 2014-03-04 – 2014-03-05 (×3): 1 g via INTRAVENOUS
  Filled 2014-03-04 (×3): qty 1

## 2014-03-04 MED ORDER — LIDOCAINE HCL (PF) 1 % IJ SOLN
INTRAMUSCULAR | Status: DC | PRN
  Administered 2014-03-04: 10 mL

## 2014-03-04 MED ORDER — PROMETHAZINE HCL 25 MG PO TABS: 12.5000 mg | ORAL_TABLET | Freq: Four times a day (QID) | ORAL | Status: DC | PRN

## 2014-03-04 MED ORDER — LACTATED RINGERS IV SOLN
INTRAVENOUS | Status: DC | PRN
  Administered 2014-03-04 (×2): via INTRAVENOUS

## 2014-03-04 MED ORDER — PANTOPRAZOLE SODIUM 40 MG IV SOLR
40.0000 mg | Freq: Every day | INTRAVENOUS | Status: DC
  Administered 2014-03-04 – 2014-03-11 (×8): 40 mg via INTRAVENOUS
  Filled 2014-03-04 (×9): qty 40

## 2014-03-04 MED ORDER — HEPARIN SODIUM (PORCINE) 5000 UNIT/ML IJ SOLN
5000.0000 [IU] | Freq: Three times a day (TID) | INTRAMUSCULAR | Status: DC
  Administered 2014-03-05 – 2014-03-06 (×4): 5000 [IU] via SUBCUTANEOUS
  Filled 2014-03-04 (×4): qty 1

## 2014-03-04 MED ORDER — HYDROMORPHONE HCL 1 MG/ML IJ SOLN
INTRAMUSCULAR | Status: DC | PRN
  Administered 2014-03-04 (×2): 0.5 mg via INTRAVENOUS
  Administered 2014-03-04: 1 mg via INTRAVENOUS

## 2014-03-04 MED ORDER — PROPOFOL 10 MG/ML IV BOLUS
INTRAVENOUS | Status: DC | PRN
  Administered 2014-03-04: 180 mg via INTRAVENOUS

## 2014-03-04 MED ORDER — HYDROMORPHONE HCL 1 MG/ML IJ SOLN
INTRAMUSCULAR | Status: AC
  Filled 2014-03-04: qty 1

## 2014-03-04 MED ORDER — SODIUM CHLORIDE 0.9 % IJ SOLN: 9.0000 mL | INTRAMUSCULAR | Status: DC | PRN

## 2014-03-04 MED ORDER — LIDOCAINE HCL (CARDIAC) 20 MG/ML IV SOLN
INTRAVENOUS | Status: DC | PRN
  Administered 2014-03-04: 50 mg via INTRAVENOUS

## 2014-03-04 MED ORDER — METOPROLOL TARTRATE 1 MG/ML IV SOLN
1.0000 mg | INTRAVENOUS | Status: DC | PRN
  Administered 2014-03-04 (×2): 1 mg via INTRAVENOUS

## 2014-03-04 MED ORDER — TISSEEL VH 10 ML EX KIT
PACK | CUTANEOUS | Status: AC
  Filled 2014-03-04: qty 1

## 2014-03-04 MED ORDER — BUPIVACAINE-EPINEPHRINE 0.25% -1:200000 IJ SOLN
INTRAMUSCULAR | Status: DC | PRN
  Administered 2014-03-04: 10 mL

## 2014-03-04 MED ORDER — MIDAZOLAM HCL 2 MG/2ML IJ SOLN
INTRAMUSCULAR | Status: AC
  Filled 2014-03-04: qty 2

## 2014-03-04 MED ORDER — PROPOFOL 10 MG/ML IV BOLUS
INTRAVENOUS | Status: AC
  Filled 2014-03-04: qty 20

## 2014-03-04 MED ORDER — EPHEDRINE SULFATE 50 MG/ML IJ SOLN
INTRAMUSCULAR | Status: DC | PRN
  Administered 2014-03-04: 10 mg via INTRAVENOUS
  Administered 2014-03-04: 5 mg via INTRAVENOUS
  Administered 2014-03-04: 10 mg via INTRAVENOUS

## 2014-03-04 MED ORDER — ONDANSETRON HCL 4 MG/2ML IJ SOLN
INTRAMUSCULAR | Status: AC
  Filled 2014-03-04: qty 2

## 2014-03-04 MED ORDER — PROMETHAZINE HCL 25 MG RE SUPP: 12.5000 mg | Freq: Four times a day (QID) | RECTAL | Status: DC | PRN

## 2014-03-04 MED ORDER — CHLORHEXIDINE GLUCONATE 0.12 % MT SOLN
15.0000 mL | Freq: Two times a day (BID) | OROMUCOSAL | Status: DC
  Administered 2014-03-05 – 2014-03-14 (×18): 15 mL via OROMUCOSAL
  Filled 2014-03-04 (×23): qty 15

## 2014-03-04 MED ORDER — INSULIN ASPART 100 UNIT/ML ~~LOC~~ SOLN
SUBCUTANEOUS | Status: AC
  Filled 2014-03-04: qty 1

## 2014-03-04 MED ORDER — INSULIN ASPART 100 UNIT/ML ~~LOC~~ SOLN
3.0000 [IU] | Freq: Once | SUBCUTANEOUS | Status: AC
  Administered 2014-03-04: 3 [IU] via SUBCUTANEOUS

## 2014-03-04 MED ORDER — LIDOCAINE HCL 1 % IJ SOLN
INTRAMUSCULAR | Status: AC
  Filled 2014-03-04: qty 20

## 2014-03-04 MED ORDER — NEOSTIGMINE METHYLSULFATE 10 MG/10ML IV SOLN
INTRAVENOUS | Status: AC
  Filled 2014-03-04: qty 1

## 2014-03-04 MED ORDER — ROCURONIUM BROMIDE 100 MG/10ML IV SOLN
INTRAVENOUS | Status: DC | PRN
  Administered 2014-03-04 (×2): 10 mg via INTRAVENOUS
  Administered 2014-03-04: 5 mg via INTRAVENOUS
  Administered 2014-03-04: 45 mg via INTRAVENOUS
  Administered 2014-03-04 (×7): 10 mg via INTRAVENOUS

## 2014-03-04 MED ORDER — BUPIVACAINE ON-Q PAIN PUMP (FOR ORDER SET NO CHG)
INJECTION | Status: AC
  Filled 2014-03-04: qty 1

## 2014-03-04 MED ORDER — CETYLPYRIDINIUM CHLORIDE 0.05 % MT LIQD
7.0000 mL | Freq: Two times a day (BID) | OROMUCOSAL | Status: DC
  Administered 2014-03-05 – 2014-03-14 (×13): 7 mL via OROMUCOSAL

## 2014-03-04 MED ORDER — MORPHINE SULFATE 2 MG/ML IJ SOLN
1.0000 mg | INTRAMUSCULAR | Status: DC | PRN
  Administered 2014-03-04: 2 mg via INTRAVENOUS
  Filled 2014-03-04: qty 1

## 2014-03-04 MED ORDER — BUPIVACAINE 0.25 % ON-Q PUMP DUAL CATH 300 ML
300.0000 mL | INJECTION | Status: DC
  Filled 2014-03-04: qty 300

## 2014-03-04 MED ORDER — ONDANSETRON HCL 4 MG/2ML IJ SOLN
4.0000 mg | Freq: Four times a day (QID) | INTRAMUSCULAR | Status: DC | PRN
  Administered 2014-03-04: 4 mg via INTRAVENOUS

## 2014-03-04 MED ORDER — LACTATED RINGERS IV SOLN
INTRAVENOUS | Status: DC
  Administered 2014-03-04: 1000 mL via INTRAVENOUS

## 2014-03-04 MED ORDER — ONDANSETRON HCL 4 MG/2ML IJ SOLN
4.0000 mg | INTRAMUSCULAR | Status: DC | PRN
  Administered 2014-03-05: 8 mg via INTRAVENOUS
  Filled 2014-03-04: qty 4

## 2014-03-04 MED ORDER — LORAZEPAM BOLUS VIA INFUSION
0.5000 mg | Freq: Two times a day (BID) | INTRAVENOUS | Status: DC | PRN
  Filled 2014-03-04: qty 1

## 2014-03-04 MED ORDER — LORAZEPAM 2 MG/ML IJ SOLN
0.5000 mg | Freq: Two times a day (BID) | INTRAMUSCULAR | Status: DC | PRN
  Administered 2014-03-05: 0.5 mg via INTRAVENOUS
  Filled 2014-03-04: qty 1

## 2014-03-04 MED ORDER — PHENYLEPHRINE 40 MCG/ML (10ML) SYRINGE FOR IV PUSH (FOR BLOOD PRESSURE SUPPORT)
PREFILLED_SYRINGE | INTRAVENOUS | Status: AC
  Filled 2014-03-04: qty 10

## 2014-03-04 MED ORDER — CEFAZOLIN SODIUM-DEXTROSE 2-3 GM-% IV SOLR
2.0000 g | Freq: Once | INTRAVENOUS | Status: AC
  Administered 2014-03-04: 2 g via INTRAVENOUS

## 2014-03-04 MED ORDER — CEFAZOLIN SODIUM-DEXTROSE 2-3 GM-% IV SOLR
INTRAVENOUS | Status: AC
  Filled 2014-03-04: qty 50

## 2014-03-04 MED ORDER — CEFAZOLIN SODIUM-DEXTROSE 2-3 GM-% IV SOLR
2.0000 g | INTRAVENOUS | Status: AC
  Administered 2014-03-04: 2 g via INTRAVENOUS

## 2014-03-04 MED ORDER — ONDANSETRON HCL 4 MG/2ML IJ SOLN
4.0000 mg | Freq: Four times a day (QID) | INTRAMUSCULAR | Status: DC | PRN
  Filled 2014-03-04: qty 2

## 2014-03-04 MED ORDER — HYDROMORPHONE HCL 1 MG/ML IJ SOLN
0.2500 mg | INTRAMUSCULAR | Status: DC | PRN
  Administered 2014-03-04 (×5): 0.5 mg via INTRAVENOUS

## 2014-03-04 MED ORDER — PHENYLEPHRINE HCL 10 MG/ML IJ SOLN
INTRAMUSCULAR | Status: AC
Start: 2014-03-04 — End: 2014-03-04
  Filled 2014-03-04: qty 1

## 2014-03-04 MED ORDER — MORPHINE SULFATE (PF) 1 MG/ML IV SOLN
INTRAVENOUS | Status: DC
  Administered 2014-03-04 (×2): via INTRAVENOUS
  Administered 2014-03-04: 15 mg via INTRAVENOUS
  Administered 2014-03-05: 16.5 mg via INTRAVENOUS
  Administered 2014-03-05 (×3): via INTRAVENOUS
  Administered 2014-03-06: 9 mg via INTRAVENOUS
  Administered 2014-03-06: 22.5 mg via INTRAVENOUS
  Administered 2014-03-06: 25 mg via INTRAVENOUS
  Administered 2014-03-06: 1.5 mg via INTRAVENOUS
  Administered 2014-03-06: 12 mg via INTRAVENOUS
  Administered 2014-03-06: 6 mg via INTRAVENOUS
  Administered 2014-03-06: 05:00:00 via INTRAVENOUS
  Administered 2014-03-06: 13.5 mg via INTRAVENOUS
  Administered 2014-03-07: 9 mg via INTRAVENOUS
  Filled 2014-03-04 (×6): qty 25

## 2014-03-04 MED ORDER — DIPHENHYDRAMINE HCL 50 MG/ML IJ SOLN: 12.5000 mg | Freq: Four times a day (QID) | INTRAMUSCULAR | Status: DC | PRN

## 2014-03-04 MED ORDER — 0.9 % SODIUM CHLORIDE (POUR BTL) OPTIME
TOPICAL | Status: DC | PRN
  Administered 2014-03-04: 2000 mL

## 2014-03-04 MED ORDER — NALOXONE HCL 0.4 MG/ML IJ SOLN: 0.4000 mg | INTRAMUSCULAR | Status: DC | PRN

## 2014-03-04 MED ORDER — DIPHENHYDRAMINE HCL 12.5 MG/5ML PO ELIX: 12.5000 mg | ORAL_SOLUTION | Freq: Four times a day (QID) | ORAL | Status: DC | PRN

## 2014-03-04 MED ORDER — FENTANYL CITRATE 0.05 MG/ML IJ SOLN
INTRAMUSCULAR | Status: DC | PRN
  Administered 2014-03-04: 50 ug via INTRAVENOUS
  Administered 2014-03-04: 100 ug via INTRAVENOUS
  Administered 2014-03-04 (×2): 50 ug via INTRAVENOUS

## 2014-03-04 MED ORDER — ONDANSETRON HCL 4 MG/2ML IJ SOLN
INTRAMUSCULAR | Status: DC | PRN
  Administered 2014-03-04: 4 mg via INTRAVENOUS

## 2014-03-04 MED ORDER — KCL IN DEXTROSE-NACL 20-5-0.45 MEQ/L-%-% IV SOLN
INTRAVENOUS | Status: DC
  Administered 2014-03-04: 1000 mL via INTRAVENOUS
  Administered 2014-03-05 – 2014-03-08 (×6): via INTRAVENOUS
  Filled 2014-03-04 (×11): qty 1000

## 2014-03-04 MED ORDER — LACTATED RINGERS IV SOLN: INTRAVENOUS | Status: DC

## 2014-03-04 MED ORDER — PROMETHAZINE HCL 25 MG/ML IJ SOLN
12.5000 mg | Freq: Four times a day (QID) | INTRAMUSCULAR | Status: DC | PRN
  Administered 2014-03-04: 12.5 mg via INTRAVENOUS
  Filled 2014-03-04: qty 1

## 2014-03-04 MED ORDER — MORPHINE SULFATE (PF) 1 MG/ML IV SOLN
INTRAVENOUS | Status: AC
  Filled 2014-03-04: qty 25

## 2014-03-04 MED ORDER — GLYCOPYRROLATE 0.2 MG/ML IJ SOLN
INTRAMUSCULAR | Status: AC
  Filled 2014-03-04: qty 3

## 2014-03-04 MED ORDER — GLYCOPYRROLATE 0.2 MG/ML IJ SOLN
INTRAMUSCULAR | Status: DC | PRN
  Administered 2014-03-04: 0.6 mg via INTRAVENOUS

## 2014-03-04 MED ORDER — BUPIVACAINE-EPINEPHRINE (PF) 0.25% -1:200000 IJ SOLN
INTRAMUSCULAR | Status: AC
  Filled 2014-03-04: qty 30

## 2014-03-04 MED ORDER — MIDAZOLAM HCL 5 MG/5ML IJ SOLN
INTRAMUSCULAR | Status: DC | PRN
  Administered 2014-03-04: 2 mg via INTRAVENOUS

## 2014-03-04 MED ORDER — EPHEDRINE SULFATE 50 MG/ML IJ SOLN
INTRAMUSCULAR | Status: AC
  Filled 2014-03-04: qty 1

## 2014-03-04 MED ORDER — ONDANSETRON HCL 4 MG/2ML IJ SOLN: 4.0000 mg | Freq: Once | INTRAMUSCULAR | Status: DC | PRN

## 2014-03-04 MED ORDER — PHENYLEPHRINE HCL 10 MG/ML IJ SOLN
INTRAMUSCULAR | Status: DC | PRN
  Administered 2014-03-04 (×4): 40 ug via INTRAVENOUS
  Administered 2014-03-04 (×2): 80 ug via INTRAVENOUS

## 2014-03-04 MED ORDER — METOPROLOL TARTRATE 1 MG/ML IV SOLN
5.0000 mg | Freq: Four times a day (QID) | INTRAVENOUS | Status: DC
  Administered 2014-03-04 – 2014-03-11 (×25): 5 mg via INTRAVENOUS
  Filled 2014-03-04 (×33): qty 5

## 2014-03-04 MED ORDER — INSULIN ASPART 100 UNIT/ML ~~LOC~~ SOLN
0.0000 [IU] | SUBCUTANEOUS | Status: DC
  Administered 2014-03-04 (×2): 3 [IU] via SUBCUTANEOUS
  Administered 2014-03-05: 2 [IU] via SUBCUTANEOUS
  Administered 2014-03-05 (×2): 3 [IU] via SUBCUTANEOUS
  Administered 2014-03-05 (×2): 2 [IU] via SUBCUTANEOUS
  Administered 2014-03-05: 1 [IU] via SUBCUTANEOUS
  Administered 2014-03-05: 2 [IU] via SUBCUTANEOUS
  Administered 2014-03-06: 1 [IU] via SUBCUTANEOUS
  Administered 2014-03-06 (×4): 2 [IU] via SUBCUTANEOUS
  Administered 2014-03-06: 1 [IU] via SUBCUTANEOUS
  Administered 2014-03-07 (×5): 2 [IU] via SUBCUTANEOUS
  Administered 2014-03-08 (×2): 3 [IU] via SUBCUTANEOUS
  Administered 2014-03-08: 2 [IU] via SUBCUTANEOUS
  Administered 2014-03-08: 1 [IU] via SUBCUTANEOUS
  Administered 2014-03-08: 2 [IU] via SUBCUTANEOUS
  Administered 2014-03-09: 1 [IU] via SUBCUTANEOUS
  Administered 2014-03-09 (×2): 3 [IU] via SUBCUTANEOUS
  Administered 2014-03-09: 1 [IU] via SUBCUTANEOUS
  Administered 2014-03-09: 3 [IU] via SUBCUTANEOUS
  Administered 2014-03-10: 2 [IU] via SUBCUTANEOUS
  Administered 2014-03-10: 3 [IU] via SUBCUTANEOUS
  Administered 2014-03-10: 5 [IU] via SUBCUTANEOUS
  Administered 2014-03-10 (×2): 1 [IU] via SUBCUTANEOUS
  Administered 2014-03-10: 5 [IU] via SUBCUTANEOUS
  Administered 2014-03-11: 1 [IU] via SUBCUTANEOUS
  Administered 2014-03-11 (×2): 3 [IU] via SUBCUTANEOUS
  Administered 2014-03-11: 1 [IU] via SUBCUTANEOUS
  Administered 2014-03-11: 5 [IU] via SUBCUTANEOUS
  Administered 2014-03-11 (×2): 2 [IU] via SUBCUTANEOUS
  Administered 2014-03-12: 1 [IU] via SUBCUTANEOUS
  Administered 2014-03-12: 2 [IU] via SUBCUTANEOUS
  Administered 2014-03-12 (×2): 3 [IU] via SUBCUTANEOUS
  Administered 2014-03-12: 1 [IU] via SUBCUTANEOUS
  Administered 2014-03-13 (×2): 2 [IU] via SUBCUTANEOUS
  Administered 2014-03-13: 3 [IU] via SUBCUTANEOUS
  Administered 2014-03-13: 2 [IU] via SUBCUTANEOUS
  Administered 2014-03-13: 3 [IU] via SUBCUTANEOUS
  Administered 2014-03-13 – 2014-03-14 (×2): 2 [IU] via SUBCUTANEOUS
  Administered 2014-03-14: 1 [IU] via SUBCUTANEOUS
  Administered 2014-03-14: 5 [IU] via SUBCUTANEOUS
  Administered 2014-03-14: 1 [IU] via SUBCUTANEOUS

## 2014-03-04 MED ORDER — FENTANYL CITRATE 0.05 MG/ML IJ SOLN
INTRAMUSCULAR | Status: AC
  Filled 2014-03-04: qty 5

## 2014-03-04 SURGICAL SUPPLY — 139 items
BENZOIN TINCTURE PRP APPL 2/3 (GAUZE/BANDAGES/DRESSINGS) IMPLANT
BLADE EXTENDED COATED 6.5IN (ELECTRODE) ×3 IMPLANT
BLADE HEX COATED 2.75 (ELECTRODE) ×3 IMPLANT
BLADE SURG 15 STRL LF DISP TIS (BLADE) IMPLANT
BLADE SURG 15 STRL SS (BLADE)
BLADE SURG SZ10 CARB STEEL (BLADE) IMPLANT
BOOT SUTURE VASCULAR YLW (MISCELLANEOUS) ×4
CABLE HIGH FREQUENCY MONO STRZ (ELECTRODE) IMPLANT
CANISTER SUCTION 2500CC (MISCELLANEOUS) IMPLANT
CATH FOLEY 2WAY SLVR  5CC 16FR (CATHETERS)
CATH FOLEY 2WAY SLVR 5CC 16FR (CATHETERS) IMPLANT
CATH KIT ON Q 7.5IN SLV (PAIN MANAGEMENT) IMPLANT
CATH KIT ON-Q SILVERSOAK 7.5IN (CATHETERS) ×6 IMPLANT
CATH ROBINSON RED A/P 12FR (CATHETERS) IMPLANT
CATH ROBINSON RED A/P 14FR (CATHETERS) IMPLANT
CATH ROBINSON RED A/P 16FR (CATHETERS) IMPLANT
CATH ROBINSON RED A/P 18FR (CATHETERS) IMPLANT
CATH ROBINSON RED A/P 20FR (CATHETERS) IMPLANT
CHLORAPREP W/TINT 26ML (MISCELLANEOUS) ×3 IMPLANT
CLAMP SUTURE YELLOW 5 PAIRS (MISCELLANEOUS) ×2 IMPLANT
CLIP LIGATING HEM O LOK PURPLE (MISCELLANEOUS) ×6 IMPLANT
CLIP LIGATING HEMO O LOK GREEN (MISCELLANEOUS) ×3 IMPLANT
CLIP LIGATING HEMOLOK MED (MISCELLANEOUS) ×3 IMPLANT
CLIP TI LARGE 6 (CLIP) ×6 IMPLANT
CLIP TI MEDIUM 6 (CLIP) ×6 IMPLANT
CLOSURE WOUND 1/2 X4 (GAUZE/BANDAGES/DRESSINGS)
CUTTER FLEX LINEAR 45M (STAPLE) IMPLANT
DECANTER SPIKE VIAL GLASS SM (MISCELLANEOUS) ×3 IMPLANT
DISSECTOR ROUND CHERRY 3/8 STR (MISCELLANEOUS) IMPLANT
DRAIN CHANNEL 19F RND (DRAIN) ×6 IMPLANT
DRAIN PENROSE 18X1/2 LTX STRL (DRAIN) IMPLANT
DRAPE C-ARM 42X120 X-RAY (DRAPES) IMPLANT
DRAPE CAMERA CLOSED 9X96 (DRAPES) ×3 IMPLANT
DRAPE LAPAROSCOPIC ABDOMINAL (DRAPES) ×3 IMPLANT
DRAPE UNIVERSAL PACK (DRAPES) ×3 IMPLANT
DRAPE UTILITY XL STRL (DRAPES) IMPLANT
DRAPE WARM FLUID 44X44 (DRAPE) ×3 IMPLANT
DRESSING TELFA ISLAND 4X8 (GAUZE/BANDAGES/DRESSINGS) IMPLANT
DRSG PAD ABDOMINAL 8X10 ST (GAUZE/BANDAGES/DRESSINGS) IMPLANT
DRSG TELFA 4X10 ISLAND STR (GAUZE/BANDAGES/DRESSINGS) ×3 IMPLANT
DRSG TELFA PLUS 4X6 ADH ISLAND (GAUZE/BANDAGES/DRESSINGS) IMPLANT
ELECT REM PT RETURN 9FT ADLT (ELECTROSURGICAL) ×3
ELECTRODE REM PT RTRN 9FT ADLT (ELECTROSURGICAL) ×1 IMPLANT
ENDOLOOP SUT PDS II  0 18 (SUTURE)
ENDOLOOP SUT PDS II 0 18 (SUTURE) IMPLANT
EVACUATOR SILICONE 100CC (DRAIN) ×6 IMPLANT
GAUZE SPONGE 4X4 12PLY STRL (GAUZE/BANDAGES/DRESSINGS) ×3 IMPLANT
GLOVE BIO SURGEON STRL SZ 6 (GLOVE) ×3 IMPLANT
GLOVE BIO SURGEON STRL SZ7 (GLOVE) ×6 IMPLANT
GLOVE BIOGEL PI IND STRL 6.5 (GLOVE) ×1 IMPLANT
GLOVE BIOGEL PI IND STRL 7.0 (GLOVE) ×1 IMPLANT
GLOVE BIOGEL PI INDICATOR 6.5 (GLOVE) ×2
GLOVE BIOGEL PI INDICATOR 7.0 (GLOVE) ×2
GLOVE INDICATOR 6.5 STRL GRN (GLOVE) ×3 IMPLANT
GOWN SPEC L4 XLG W/TWL (GOWN DISPOSABLE) ×9 IMPLANT
GOWN STRL REUS W/ TWL XL LVL3 (GOWN DISPOSABLE) ×3 IMPLANT
GOWN STRL REUS W/TWL 2XL LVL3 (GOWN DISPOSABLE) ×9 IMPLANT
GOWN STRL REUS W/TWL XL LVL3 (GOWN DISPOSABLE) ×15 IMPLANT
HEMOSTAT SURGICEL 4X8 (HEMOSTASIS) IMPLANT
HOLDER FOLEY CATH W/STRAP (MISCELLANEOUS) ×3 IMPLANT
KIT BASIN OR (CUSTOM PROCEDURE TRAY) ×3 IMPLANT
LIQUID BAND (GAUZE/BANDAGES/DRESSINGS) IMPLANT
LOOP MINI RED (MISCELLANEOUS) IMPLANT
LOOP VESSEL MAXI BLUE (MISCELLANEOUS) ×3 IMPLANT
MANIFOLD NEPTUNE II (INSTRUMENTS) ×3 IMPLANT
NEEDLE BIOPSY 14GX4.5 SOFT TIS (NEEDLE) IMPLANT
NEEDLE HYPO 22GX1.5 SAFETY (NEEDLE) ×3 IMPLANT
NEEDLE HYPO 25X1 1.5 SAFETY (NEEDLE) ×3 IMPLANT
NS IRRIG 1000ML POUR BTL (IV SOLUTION) ×3 IMPLANT
PACK BASIC VI WITH GOWN DISP (CUSTOM PROCEDURE TRAY) ×3 IMPLANT
PACK GENERAL/GYN (CUSTOM PROCEDURE TRAY) ×3 IMPLANT
PENCIL BUTTON HOLSTER BLD 10FT (ELECTRODE) ×3 IMPLANT
PLUG CATH AND CAP STER (CATHETERS) IMPLANT
RELOAD PROXIMATE 75MM BLUE (ENDOMECHANICALS) ×12 IMPLANT
SEPRAFILM PROCEDURAL PACK 3X5 (MISCELLANEOUS) IMPLANT
SHEARS FOC LG CVD HARMONIC 17C (MISCELLANEOUS) IMPLANT
SLEEVE SURGEON STRL (DRAPES) IMPLANT
SLEEVE XCEL OPT CAN 5 100 (ENDOMECHANICALS) IMPLANT
SPONGE DRAIN TRACH 4X4 STRL 2S (GAUZE/BANDAGES/DRESSINGS) ×6 IMPLANT
SPONGE LAP 18X18 X RAY DECT (DISPOSABLE) ×15 IMPLANT
STAPLER PROXIMATE 75MM BLUE (STAPLE) ×3 IMPLANT
STAPLER VISISTAT 35W (STAPLE) IMPLANT
STRIP CLOSURE SKIN 1/2X4 (GAUZE/BANDAGES/DRESSINGS) IMPLANT
SUCTION POOLE TIP (SUCTIONS) ×3 IMPLANT
SUT 5.0 PDS RB-1 (SUTURE) ×12
SUT CHROMIC 3 0 SH 27 (SUTURE) IMPLANT
SUT CHROMIC 4 0 RB 1X27 (SUTURE) IMPLANT
SUT ETHILON 1 TP 1 60 (SUTURE) IMPLANT
SUT ETHILON 2 0 PS N (SUTURE) ×6 IMPLANT
SUT MNCRL AB 4-0 PS2 18 (SUTURE) IMPLANT
SUT PDS AB 1 TP1 96 (SUTURE) ×6 IMPLANT
SUT PDS AB 3-0 SH 27 (SUTURE) ×12 IMPLANT
SUT PDS AB 4-0 RB1 27 (SUTURE) ×36 IMPLANT
SUT PDS PLUS AB 5-0 RB-1 (SUTURE) ×6 IMPLANT
SUT PROLENE 2 0 SH DA (SUTURE) IMPLANT
SUT PROLENE 3 0 SH 48 (SUTURE) ×6 IMPLANT
SUT PROLENE 3 0 SH1 36 (SUTURE) ×6 IMPLANT
SUT PROLENE 4 0 RB 1 (SUTURE) ×4
SUT PROLENE 4-0 RB1 .5 CRCL 36 (SUTURE) ×2 IMPLANT
SUT PROLENE 5 0 CC 1 (SUTURE) IMPLANT
SUT SILK 0 FSL (SUTURE) IMPLANT
SUT SILK 2 0 (SUTURE) ×4
SUT SILK 2 0 SH (SUTURE) IMPLANT
SUT SILK 2 0 SH CR/8 (SUTURE) ×9 IMPLANT
SUT SILK 2-0 18XBRD TIE 12 (SUTURE) ×2 IMPLANT
SUT SILK 3 0 (SUTURE) ×2
SUT SILK 3 0 SH CR/8 (SUTURE) ×3 IMPLANT
SUT SILK 3-0 18XBRD TIE 12 (SUTURE) ×1 IMPLANT
SUT VIC AB 2-0 SH 27 (SUTURE)
SUT VIC AB 2-0 SH 27X BRD (SUTURE) IMPLANT
SUT VIC AB 3-0 SH 18 (SUTURE) IMPLANT
SUT VIC AB 3-0 SH 27 (SUTURE)
SUT VIC AB 3-0 SH 27XBRD (SUTURE) IMPLANT
SUT VIC AB 4-0 SH 18 (SUTURE) IMPLANT
SUT VICRYL 0 UR6 27IN ABS (SUTURE) ×3 IMPLANT
SUT VICRYL 2 0 18  UND BR (SUTURE)
SUT VICRYL 2 0 18 UND BR (SUTURE) IMPLANT
SUT VICRYL 3 0 BR 18  UND (SUTURE)
SUT VICRYL 3 0 BR 18 UND (SUTURE) IMPLANT
SYR 20CC LL (SYRINGE) IMPLANT
SYR BULB IRRIGATION 50ML (SYRINGE) IMPLANT
SYR CONTROL 10ML LL (SYRINGE) ×3 IMPLANT
SYS LAPSCP GELPORT 120MM (MISCELLANEOUS)
SYSTEM LAPSCP GELPORT 120MM (MISCELLANEOUS) IMPLANT
TAG SUTURE CLAMP YLW 5PR (MISCELLANEOUS) ×2
TAPE STRIPS DRAPE STRL (GAUZE/BANDAGES/DRESSINGS) ×3 IMPLANT
TAPE UMBILICAL COTTON 1/8X30 (MISCELLANEOUS) IMPLANT
TOWEL OR 17X26 10 PK STRL BLUE (TOWEL DISPOSABLE) ×6 IMPLANT
TOWEL OR NON WOVEN STRL DISP B (DISPOSABLE) ×6 IMPLANT
TRAY FOLEY CATH 14FRSI W/METER (CATHETERS) IMPLANT
TRAY FOLEY CATH 16FRSI W/METER (SET/KITS/TRAYS/PACK) ×3 IMPLANT
TROCAR XCEL BLUNT TIP 100MML (ENDOMECHANICALS) ×3 IMPLANT
TROCAR XCEL NON-BLD 5MMX100MML (ENDOMECHANICALS) ×3 IMPLANT
TUBE FEEDING 5FR 36IN KANGAROO (TUBING) ×3 IMPLANT
TUBE FEEDING 8FR 16IN STR KANG (MISCELLANEOUS) IMPLANT
TUBING INSUFFLATION 10FT LAP (TUBING) ×3 IMPLANT
TUNNELER SHEATH ON-Q 16GX12 DP (PAIN MANAGEMENT) ×3 IMPLANT
YANKAUER SUCT BULB TIP 10FT TU (MISCELLANEOUS) ×3 IMPLANT
YANKAUER SUCT BULB TIP NO VENT (SUCTIONS) ×3 IMPLANT

## 2014-03-04 NOTE — Progress Notes (Signed)
ELINK note S/p Whipple Hemodynamically stable OK on camera check No intervention needed

## 2014-03-04 NOTE — Anesthesia Postprocedure Evaluation (Signed)
Anesthesia Post Note  Patient: Peter Summers  Procedure(s) Performed: Procedure(s) (LRB):  DIAGNOSTIC LAPAROSCOPY, PANCREATICODUODENECTOMY, REMOVAL OF BILIARY STENT, PLACEMENT OF PANCREATIC DUCT STENT (N/A) HERNIA REPAIR UMBILICAL ADULT (N/A)  Anesthesia type: General  Patient location: PACU  Post pain: Pain level controlled  Post assessment: Post-op Vital signs reviewed  Last Vitals: BP 115/83  Pulse 109  Temp(Src) 37.3 C (Oral)  Resp 22  Ht 6\' 1"  (1.854 m)  Wt 180 lb 8.9 oz (81.9 kg)  BMI 23.83 kg/m2  SpO2 99%  Post vital signs: Reviewed  Level of consciousness: sedated  Complications: No apparent anesthesia complications

## 2014-03-04 NOTE — Transfer of Care (Signed)
Immediate Anesthesia Transfer of Care Note  Patient: Peter Summers  Procedure(s) Performed: Procedure(s):  DIAGNOSTIC LAPAROSCOPY, PANCREATICODUODENECTOMY, REMOVAL OF BILIARY STENT, PLACEMENT OF PANCREATIC DUCT STENT (N/A) HERNIA REPAIR UMBILICAL ADULT (N/A)  Patient Location: PACU  Anesthesia Type:General  Level of Consciousness: awake, alert  and oriented  Airway & Oxygen Therapy: Patient Spontanous Breathing and Patient connected to face mask oxygen  Post-op Assessment: Report given to PACU RN and Post -op Vital signs reviewed and stable  Post vital signs: Reviewed and stable  Complications: No apparent anesthesia complications

## 2014-03-04 NOTE — Anesthesia Preprocedure Evaluation (Addendum)
Anesthesia Evaluation  Patient identified by MRN, date of birth, ID band Patient awake    Reviewed: Allergy & Precautions, H&P , NPO status , Patient's Chart, lab work & pertinent test results  Airway Mallampati: II  TM Distance: >3 FB Neck ROM: Full    Dental no notable dental hx.    Pulmonary neg pulmonary ROS,  breath sounds clear to auscultation  Pulmonary exam normal       Cardiovascular hypertension, Pt. on medications Rhythm:Regular Rate:Normal     Neuro/Psych Anxiety negative neurological ROS     GI/Hepatic negative GI ROS, Neg liver ROS,   Endo/Other  diabetes, Poorly Controlled, Type 2, Oral Hypoglycemic Agents  Renal/GU negative Renal ROS  negative genitourinary   Musculoskeletal negative musculoskeletal ROS (+)   Abdominal   Peds negative pediatric ROS (+)  Hematology negative hematology ROS (+)   Anesthesia Other Findings   Reproductive/Obstetrics negative OB ROS                            Anesthesia Physical Anesthesia Plan  ASA: III  Anesthesia Plan: General   Post-op Pain Management:    Induction: Intravenous  Airway Management Planned: Oral ETT  Additional Equipment: Arterial line  Intra-op Plan:   Post-operative Plan: Extubation in OR  Informed Consent: I have reviewed the patients History and Physical, chart, labs and discussed the procedure including the risks, benefits and alternatives for the proposed anesthesia with the patient or authorized representative who has indicated his/her understanding and acceptance.   Dental advisory given  Plan Discussed with: CRNA  Anesthesia Plan Comments:         Anesthesia Quick Evaluation

## 2014-03-04 NOTE — H&P (Signed)
Peter Summers Location: [facility] Patient #: [id number] DOB: 1940-12-12 Married / Language: English / Race: Black or African American Male  History of Present Illness  Patient words: mass on his pancreas.  The patient is a 73 year old male who presents with a pancreatic mass. The patient was referred by a gastroenterologist (Drs. Collene Mares and Cameron Park). The mass was discovered by a specialist care provider. The mass was found 3 week(s) ago. The mass is located in the uncinate process. Initial presentation included abdominal pain. Past evaluation has included abdominal ultrasonography (endoscopic ultrasound), abdominal CT, abdominal MRI and gastroenterology evaluation. Past treatment has included opioid analgesics (percocet 1 tab q4h). Symptoms include abdominal pain, diabetes, diarrhea, early satiety and weight loss (10-12 pounds). The pain is located in the epigastric region. There is no radiation. The patient describes the pain as dull. Onset was sudden (came on 2 days after colonoscopy) 3 week(s) ago. The patient describes this as severe and unchanged. Symptoms are exacerbated by eating. Symptoms are relieved by analgesics. Current treatment includes opioid analgesics and gastroenterologist care. By report there is good compliance with treatment. The patient has a surgical history of appendectomy. pt had EUS and biopsy which was positive for neoplasm, but they could not tell if it was neuroendocrine or acinar cell carcinoma.    Other Problems Anxiety Disorder Bladder Problems Depression Diabetes Mellitus Enlarged Prostate High blood pressure Pancreatic Cancer Pancreatitis Umbilical Hernia Repair  Past Surgical History  Appendectomy Colon Polyp Removal - Colonoscopy Shoulder Surgery Right. Spinal Surgery - Lower Back  Diagnostic Studies History  Colonoscopy within last year  Allergies No Known Drug Allergies09/17/2015  Medication History  KlonoPIN  (1MG  Tablet, Oral) Active. Ferrous Fumarate (325 (106 Fe)MG Tablet, Oral) Active. Proscar (5MG  Tablet, Oral) Active. Lisinopril-Hydrochlorothiazide (20-12.5MG  Tablet, Oral) Active. MetFORMIN HCl (500MG  Tablet, Oral) Active. Multiple Vitamin (Oral) Active. Oxycodone-Acetaminophen (5-325MG  Tablet, Oral) Active. Medications Reconciled  Social History Tobacco use Former smoker.  Family History Diabetes Mellitus Family Members In General.  Review of Systems General Present- Weight Loss. Not Present- Appetite Loss, Chills, Fatigue, Fever, Night Sweats and Weight Gain. Skin Not Present- Change in Wart/Mole, Dryness, Hives, Jaundice, New Lesions, Non-Healing Wounds, Rash and Ulcer. HEENT Present- Ringing in the Ears. Not Present- Earache, Hearing Loss, Hoarseness, Nose Bleed, Oral Ulcers, Seasonal Allergies, Sinus Pain, Sore Throat, Visual Disturbances, Wears glasses/contact lenses and Yellow Eyes. Respiratory Present- Snoring. Not Present- Bloody sputum, Chronic Cough, Difficulty Breathing and Wheezing. Gastrointestinal Present- Abdominal Pain, Bloating, Change in Bowel Habits, Chronic diarrhea and Excessive gas. Not Present- Bloody Stool, Constipation, Difficulty Swallowing, Gets full quickly at meals, Hemorrhoids, Indigestion, Nausea, Rectal Pain and Vomiting. Male Genitourinary Present- Frequency and Nocturia. Not Present- Blood in Urine, Change in Urinary Stream, Impotence, Painful Urination, Urgency and Urine Leakage. Psychiatric Present- Anxiety and Depression. Not Present- Bipolar, Change in Sleep Pattern, Fearful and Frequent crying. Endocrine Present- New Diabetes. Not Present- Cold Intolerance, Excessive Hunger, Hair Changes, Heat Intolerance and Hot flashes.   Vitals  Wt Readings from Last 3 Encounters:  03/01/14 175 lb 14.4 oz (79.788 kg)  01/29/14 185 lb (83.915 kg)  01/29/14 185 lb (83.915 kg)   Temp Readings from Last 3 Encounters:  03/04/14 97.6 F (36.4 C) Oral   03/04/14 97.6 F (36.4 C) Oral  03/01/14 98.3 F (36.8 C) Oral   BP Readings from Last 3 Encounters:  03/04/14 122/77  03/04/14 122/77  03/01/14 133/72   Pulse Readings from Last 3 Encounters:  03/04/14 90  03/04/14 90  03/01/14 95     Physical Exam  General Mental Status-Alert. General Appearance-Consistent with stated age. Hydration-Well hydrated. Voice-Normal.  Head and Neck Head-normocephalic, atraumatic with no lesions or palpable masses. Trachea-midline. Thyroid Gland Characteristics - normal size and consistency. Note: Long hair covered by hat, spiritual.   Eye Eyeball - Bilateral-Extraocular movements intact. Sclera/Conjunctiva - Bilateral-No scleral icterus.  Chest and Lung Exam Chest and lung exam reveals -quiet, even and easy respiratory effort with no use of accessory muscles and on auscultation, normal breath sounds, no adventitious sounds and normal vocal resonance. Inspection Chest Wall - Normal. Back - normal.  Cardiovascular Cardiovascular examination reveals -normal heart sounds, regular rate and rhythm with no murmurs and normal pedal pulses bilaterally.  Abdomen Inspection Skin - Scar - Epigastrium and Right Lower Quadrant. Hernias - Umbilical hernia - ZOXWRUEAV(4.0 cm, sensitive). Palpation/Percussion Palpation and Percussion of the abdomen reveal - Soft, No Rebound tenderness, No Rigidity (guarding) and No hepatosplenomegaly. Tenderness - Epigastrium. Auscultation Auscultation of the abdomen reveals - Bowel sounds normal.  Neurologic Neurologic evaluation reveals -alert and oriented x 3 with no impairment of recent or remote memory. Mental Status-Normal.  Musculoskeletal Normal Exam - Left-Upper Extremity Strength Normal and Lower Extremity Strength Normal. Normal Exam - Right-Upper Extremity Strength Normal and Lower Extremity Strength Normal.  Lymphatic Head & Neck  General Head & Neck Lymphatics:  Bilateral - Description - Normal. Axillary  General Axillary Region: Bilateral - Description - Normal. Tenderness - Non Tender.    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 I would not do another biopsy, as I don't think it would affect treatment recommendations. We will plan whipple.  I reviewed the surgery with the patient.. I discussed the potential for diagnostic laparoscopy. In the case of pancreatic cancer, if spread of the disease is found, we will abort the procedure and not proceed with resection. The rationale for this was discussed with the patient. There has not been data to support resection of Stage IV disease in terms of survival benefit.  We discussed possible complications including: Potential of aborting procedure if tumor is invading the superior mesenteric or hepatic arteries Bleeding Infection and possible wound complications such as hernia Damage to adjacent structures Leak of anastamoses, primarily pancreatic Possible need for other procedures Possible prolonged hospital stay Possible development of diabetes or worsening of current diabetes. Possible pancreatic exocrine insufficiency Prolonged fatigue/weakness/appetite Difficulty with eating or post operative nausea Possible early recurrence of cancer

## 2014-03-04 NOTE — Op Note (Signed)
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   PROCEDURES PERFORMED:  Diagnostic laparoscopy Classic pancreaticoduodenectomy  Removal of metal biliary stent Placement of pancreatic stent  Closure of umbilical hernia with wound closure.  SURGEON: [name], MD   ASSISTANT: Armandina Gemma, MD   ANESTHESIA: General and local  FINDINGS: 3 cm pancreatic head mass. Firm pancreatic tissue. 13 mm common bile duct. 3 mm pancreatic duct  SPECIMENS:  1. Pancreaticoduodenectomy with gallbladder:  2. Portal nodes  3.  Omentum 4.  Common hepatic artery LN   ESTIMATED BLOOD LOSS: 450 mL.   COMPLICATIONS: None known.   PROCEDURE:   Pt was identified in the holding area and taken to  the operating room, and placed supine on the operating room  table. General anesthesia was induced. The patient's abdomen was  prepped and draped in a sterile fashion, after a Foley catheter was  placed. A time-out was performed according to the surgical safety check  list. When all was correct we continued.   The patient was placed in reverse trendelenburg position and rotated to the right.  A vertical midline incision was made just below the umbilicus 1.5 cm in length.  The subcutaneous tissues were spread with a Kelly clamp.  The fascia was elevated and incised with a #11 blade.  A pursestring suture was placed around the fascial incision.  The Hasson trocar was advanced into the abdomen.  The abdomen was insufflated with carbon dioxide.  The abdomen was examined.  Two additional 5 mm ports were placed in the right abdomen.  The adhesions from his prior hernia repair were taken down sharply.  The liver, diaphragm, and peritoneal surfaces were examined.  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  This was NOT biopsied.  There was no evidence of carcinomatosis.      A midline incision was  made from the xiphoid to just below the umbilicus.  The umbilical hernia was opened and incorporated into the incision. The subcutaneous tissues were divided with the Bovie cautery. The peritoneum was entered in the center of the abdomen. Digital retraction was then used to elevate the preperitoneal fat, and  this was taken with the cautery as well. Care was taken to protect the underlying viscera.   The Bookwalter self-retaining retractor was placed for visualization. The right colon was taken down off of the white line  of Toldt and from the retroperitoneum at the hepatic flexure. The porta was identified. The  duodenum was kocherized extensively with blunt dissection and with cautery. The gallbladder was taken off the liver with a combination of blunt dissection and cautery. The cystic duct was clipped with the Hemalock clips. The cystic duct was divided and the gallbladder was passed off.   There were extensive inflammatory changes in the porta.  The common bile duct was skeletonized near the duodenum. A vessel loop was passed around it. The gastroduodenal artery, as well as the common hepatic artery were skeletonized. The proper hepatic artery was traced out to make sure that flow was going to both sides of the liver when the GDA was clamped. The GDA was test clamped with the bulldog, with good flow to the liver and no signs of ischemia. This was divided with 2-0 silk ties and then clipped. The proper hepatic artery was reflected upward, and the anterior portal vein was exposed. A Vanderbilt clamp was passed underneath the pancreas at  the superior mesenteric vein.  Attention was then directed to the stomach, and the omentum was taken  off of the stomach at the border of the antrum and the body. The  gastrohepatic ligament was taken down with the harmonic, and care was  taken to make sure there was not a replaced left hepatic artery in this  location. The stomach was divided with the GIA-75 stapler.  The border  of the stomach was oversewn with a 3-0 running PDS suture.   Attention was then directed to the small bowel. Around 10 cm past the  ligament of Treitz it was located, and this was divided with the 75-GIA.  The distal portion of the jejunum was also oversewn with a 3-0 PDS  suture. The fourth portion of the duodenum was skeletonized with the  harmonic scalpel, taking down all of the mesenteric vessels. The  ligament of Treitz was taken down. The IMV was preserved.  The duodenum was then passed underneath the portal vein.   At this point the Claiborne Billings was replaced and the pancreas was divided with the cautery. 2-0 silk sutures were tied down and the inferior and superior border of the pancreas. The Bovie was used to coagulate the small bleeders at the border of the pancreas. The Overholt in combination with the harmonic, metal clips and locking Weck clips  were then used to take the uncinate process off of the portal vein and  the superior mesenteric artery. There was significant inflammation in this area, and several small areas of bleeding on the portal vein were suture ligated.  Care was taken not to incorporate the  superior mesenteric artery in the dissection. The specimen was then marked and passed off the table for frozen section margin. An additional lymph node was taken off laterally off the portal vein, and this also created some bleeding, this was repaired with 3-0 prolene.    The jejunum was then passed through a defect in the colonic mesentery. The mesenteric defect was closed down gently.  There was appropriate lie for the pancreatic and biliary  anastomoses. The more distal portion of the jejunum was pulled up over  the colon, and two 3-0 silks were placed through the posterior border  of the stomach for the gastrojejunostomy. The stomach and the small  bowel were opened, and a GIA-75 was used to create an end-to-end  anastomosis. The open areas of the staple line were  examined to ensure  that there was hemostasis. The defect was then closed with a single  layer of running Connell suture of 3-0 PDS. Prior to a complete  closure, the NG tube was passed toward the afferent limb. At this point the frozens returned back as all negative margins.   The appropriate location for the choledochojejunostomy was identified, and  the small bowel was opened approximately 12 mm. The anastamosis was created with approximately twelve 4-0 interrupted PDS sutures.   The 2 corner sutures were placed first  and then the posterior layer was done in an interrupted fashion tying on  the inside. The superior layer was then closed with interrupted sutures as  well.   The pancreatic anastomosis was then created by opening the jejunum the length  of the pancreatic parenchyma. The pancreas was very firm, and the duct was 3 mm. A pediatric feeding tube was used as a pancreatic stent. The posterior layer was formed first with 2-0 silk sutures in interrupted fashion. A duct-to-mucosa anastamosis was created with four 5-0 PDS  sutures around the stent.   The anterior layer was then oversewn with 2-0 silks to dunk the pancreatic parenchyma.   The abdomen was irrigated.  There was no evidence of bleeding.    The areas were then irrigated and then those anastomoses were covered  with Tisseal. The omentum was used to wrap around the anastamoses.  The tisseal was allowed to dry. The abdomen was then irrigated  again and all the laparotomy sponges were removed. A lap count was  performed, which was correct. Two 19-Blake drains were placed, with the  lateral-most drain placed behind the choledochojejunostomy. The medial  Blake drain was placed just anterior and slightly superior to the  Pancreaticojejunostomy.  These were secured with 2-0 nylons. The OnQ tunneler was placed on either side of the incision.  The fascia was then closed with #1 looped running PDS sutures. The skin was irrigated and then  closed with  staples. The wounds were cleaned, dried and dressed with a sterile  dressing.   The patient tolerated the procedure well and was extubated and taken to  PACU in stable condition. Needle and sponge counts were correct x2.

## 2014-03-05 LAB — COMPREHENSIVE METABOLIC PANEL
ALT: 38 U/L (ref 0–53)
AST: 34 U/L (ref 0–37)
Albumin: 2.7 g/dL — ABNORMAL LOW (ref 3.5–5.2)
Alkaline Phosphatase: 80 U/L (ref 39–117)
Anion gap: 11 (ref 5–15)
BUN: 21 mg/dL (ref 6–23)
CO2: 25 mEq/L (ref 19–32)
Calcium: 8.2 mg/dL — ABNORMAL LOW (ref 8.4–10.5)
Chloride: 102 mEq/L (ref 96–112)
Creatinine, Ser: 1.16 mg/dL (ref 0.50–1.35)
GFR calc Af Amer: 70 mL/min — ABNORMAL LOW (ref >90)
GFR calc non Af Amer: 61 mL/min — ABNORMAL LOW (ref >90)
Glucose, Bld: 224 mg/dL — ABNORMAL HIGH (ref 70–99)
Potassium: 5.1 mEq/L (ref 3.7–5.3)
Sodium: 138 mEq/L (ref 137–147)
Total Bilirubin: 0.5 mg/dL (ref 0.3–1.2)
Total Protein: 6 g/dL (ref 6.0–8.3)

## 2014-03-05 LAB — POCT I-STAT 4, (NA,K, GLUC, HGB,HCT)
Glucose, Bld: 208 mg/dL — ABNORMAL HIGH (ref 70–99)
Glucose, Bld: 235 mg/dL — ABNORMAL HIGH (ref 70–99)
HCT: 22 % — ABNORMAL LOW (ref 39.0–52.0)
HCT: 25 % — ABNORMAL LOW (ref 39.0–52.0)
Hemoglobin: 7.5 g/dL — ABNORMAL LOW (ref 13.0–17.0)
Hemoglobin: 8.5 g/dL — ABNORMAL LOW (ref 13.0–17.0)
Potassium: 3.3 mEq/L — ABNORMAL LOW (ref 3.7–5.3)
Potassium: 3.8 mEq/L (ref 3.7–5.3)
Sodium: 139 mEq/L (ref 137–147)
Sodium: 137 mEq/L (ref 137–147)

## 2014-03-05 LAB — GLUCOSE, CAPILLARY
Glucose-Capillary: 151 mg/dL — ABNORMAL HIGH (ref 70–99)
Glucose-Capillary: 151 mg/dL — ABNORMAL HIGH (ref 70–99)
Glucose-Capillary: 162 mg/dL — ABNORMAL HIGH (ref 70–99)
Glucose-Capillary: 173 mg/dL — ABNORMAL HIGH (ref 70–99)
Glucose-Capillary: 199 mg/dL — ABNORMAL HIGH (ref 70–99)
Glucose-Capillary: 204 mg/dL — ABNORMAL HIGH (ref 70–99)
Glucose-Capillary: 206 mg/dL — ABNORMAL HIGH (ref 70–99)

## 2014-03-05 LAB — CBC
HCT: 26.5 % — ABNORMAL LOW (ref 39.0–52.0)
Hemoglobin: 9.1 g/dL — ABNORMAL LOW (ref 13.0–17.0)
MCH: 27.7 pg (ref 26.0–34.0)
MCHC: 34.3 g/dL (ref 30.0–36.0)
MCV: 80.5 fL (ref 78.0–100.0)
Platelets: 212 10*3/uL (ref 150–400)
RBC: 3.29 MIL/uL — ABNORMAL LOW (ref 4.22–5.81)
RDW: 15.5 % (ref 11.5–15.5)
WBC: 14.4 10*3/uL — ABNORMAL HIGH (ref 4.0–10.5)

## 2014-03-05 LAB — PROTIME-INR
INR: 1.43 (ref 0.00–1.49)
Prothrombin Time: 17.6 seconds — ABNORMAL HIGH (ref 11.6–15.2)

## 2014-03-05 LAB — MAGNESIUM: Magnesium: 1.5 mg/dL (ref 1.5–2.5)

## 2014-03-05 LAB — PHOSPHORUS: Phosphorus: 3.8 mg/dL (ref 2.3–4.6)

## 2014-03-05 MED ORDER — ALBUMIN HUMAN 5 % IV SOLN
25.0000 g | Freq: Once | INTRAVENOUS | Status: AC
  Administered 2014-03-05: 25 g via INTRAVENOUS
  Filled 2014-03-05: qty 500

## 2014-03-05 MED ORDER — LORAZEPAM 2 MG/ML IJ SOLN
0.5000 mg | Freq: Four times a day (QID) | INTRAMUSCULAR | Status: DC | PRN
  Administered 2014-03-05 – 2014-03-07 (×2): 0.5 mg via INTRAVENOUS
  Filled 2014-03-05 (×2): qty 1

## 2014-03-05 NOTE — Progress Notes (Signed)
1 Day Post-Op  Subjective: Sl anxious.  Pain control OK.  Has been out of bed.  Mild nausea.    Objective: Vital signs in last 24 hours: Temp:  [97.4 F (36.3 C)-99.8 F (37.7 C)] 97.4 F (36.3 C) (10/30 0800) Pulse Rate:  [95-140] 140 (10/30 0800) Resp:  [6-27] 21 (10/30 0900) BP: (87-138)/(60-83) 90/60 mmHg (10/30 0900) SpO2:  [96 %-100 %] 99 % (10/30 0800) Arterial Line BP: (115-154)/(60-83) 115/83 mmHg (10/29 1536) FiO2 (%):  [99 %] 99 % (10/30 0737) Weight:  [180 lb 8.9 oz (81.9 kg)] 180 lb 8.9 oz (81.9 kg) (10/29 1536) Last BM Date: 03/03/14  Intake/Output from previous day: 10/29 0701 - 10/30 0700 In: 9621.5 [I.V.:9431.5; NG/GT:40; IV Piggyback:150] Out: 4825 [Urine:1900; Drains:290; Blood:450] Intake/Output this shift: Total I/O In: 100 [I.V.:100] Out: 220 [Urine:110; Drains:110]  General appearance: alert, cooperative and no distress Resp: breathing comfortably GI: soft, non distended, small amount of staining on dressing.  drains serosang  Lab Results:   Recent Labs  03/04/14 1630 03/05/14 0540  WBC 11.6* 14.4*  HGB 9.1* 9.1*  HCT 26.4* 26.5*  PLT 266 212   BMET  Recent Labs  03/04/14 1322 03/04/14 1630 03/05/14 0540  NA 137  --  138  K 3.8  --  5.1  CL  --   --  102  CO2  --   --  25  GLUCOSE 235*  --  224*  BUN  --   --  21  CREATININE  --  0.84 1.16  CALCIUM  --   --  8.2*   PT/INR  Recent Labs  03/05/14 0540  LABPROT 17.6*  INR 1.43   ABG No results found for this basename: PHART, PCO2, PO2, HCO3,  in the last 72 hours  Studies/Results: No results found.  Anti-infectives: Anti-infectives   Start     Dose/Rate Route Frequency Ordered Stop   03/04/14 1800  cefOXitin (MEFOXIN) 1 g in dextrose 5 % 50 mL IVPB     1 g 100 mL/hr over 30 Minutes Intravenous Every 6 hours 03/04/14 1614 03/05/14 0615   03/04/14 1200  ceFAZolin (ANCEF) IVPB 2 g/50 mL premix     2 g 100 mL/hr over 30 Minutes Intravenous  Once 03/04/14 1157 03/04/14  1157   03/04/14 0519  ceFAZolin (ANCEF) IVPB 2 g/50 mL premix     2 g 100 mL/hr over 30 Minutes Intravenous On call to O.R. 03/04/14 0037 03/04/14 0748      Assessment/Plan: s/p Procedure(s):  DIAGNOSTIC LAPAROSCOPY, PANCREATICODUODENECTOMY, REMOVAL OF BILIARY STENT, PLACEMENT OF PANCREATIC DUCT STENT (N/A) HERNIA REPAIR UMBILICAL ADULT (N/A) PAS Continue foley due to patient in ICU and urinary output monitoring NPO/NGT If no n/v and low amount NGT output, plan d/c NGT tomorrow and start clears. 24 hours antibiotics. Ativan for anxiety. RISS for hyperglycemia Chronic anemia and ABL anemia - stable. Sl hypovolemia - albumin bolus.    LOS: 1 day    Peter Summers 03/05/2014

## 2014-03-05 NOTE — Care Management Note (Addendum)
    Page 1 of 2   03/15/2014     10:32:14 AM CARE MANAGEMENT NOTE 03/15/2014  Patient:  RITHVIK, ORCUTT   Account Number:  [id number]  Date Initiated:  03/05/2014  Documentation initiated by:  DAVIS,RHONDA  Subjective/Objective Assessment:   73 year old male who presents with a pancreatic mass     Action/Plan:   in icu post op a.lines,hypotensive,will follow for needs   Anticipated DC Date:  03/10/2014   Anticipated DC Plan:  [place]  In-house referral  NA      DC Planning Services  CM consult      [facility] Choice  HOME HEALTH   Choice offered to / List presented to:  C-1 Patient   DME arranged  NA      DME agency  NA     [place] arranged  HH-1 RN  [place].   Status of service:  Completed, signed off Medicare Important Message given?  YES (If response is "NO", the following Medicare IM given date fields will be blank) Date Medicare IM given:  03/09/2014 Medicare IM given by:  [facility] Date Additional Medicare IM given:  03/15/2014 Additional Medicare IM given by:  Sunday Spillers  Discharge Disposition:  [place]  Per UR Regulation:  Reviewed for med. necessity/level of care/duration of stay  If discussed at [place] of Stay Meetings, dates discussed:   03/08/2014  03/11/2014    Comments:  03-15-14 Sunday Spillers RN CM 1000 Spoke with patient at bedside with wife present. Will need HH RN to assist with dressing changes at d/c. Patient offered choice. Contacted [facility] to arrange, they will provide [facility] services.  03-11-14 Sunday Spillers RN CM 1000 Spoke with patient as he was ambulating in the hall. States he has no concerns or needs regarding d/c. Wife was present. States he will be able to make f/u appt, has access to meds, ambulating independently.  [id number] Rosana Hoes, RN, BSN, CCM Chart reviewed. Discharge needs and patient's stay to be reviewed and followed by  case manager.

## 2014-03-06 LAB — CBC WITH DIFFERENTIAL/PLATELET
Basophils Absolute: 0 10*3/uL (ref 0.0–0.1)
Basophils Relative: 0 % (ref 0–1)
Eosinophils Absolute: 0.5 10*3/uL (ref 0.0–0.7)
Eosinophils Relative: 3 % (ref 0–5)
HCT: 29.7 % — ABNORMAL LOW (ref 39.0–52.0)
Hemoglobin: 9.8 g/dL — ABNORMAL LOW (ref 13.0–17.0)
Lymphocytes Relative: 13 % (ref 12–46)
Lymphs Abs: 2.3 10*3/uL (ref 0.7–4.0)
MCH: 27.6 pg (ref 26.0–34.0)
MCHC: 33 g/dL (ref 30.0–36.0)
MCV: 83.7 fL (ref 78.0–100.0)
Monocytes Absolute: 1.5 10*3/uL — ABNORMAL HIGH (ref 0.1–1.0)
Monocytes Relative: 8 % (ref 3–12)
Neutro Abs: 13.5 10*3/uL — ABNORMAL HIGH (ref 1.7–7.7)
Neutrophils Relative %: 76 % (ref 43–77)
Platelets: 156 10*3/uL (ref 150–400)
RBC: 3.55 MIL/uL — ABNORMAL LOW (ref 4.22–5.81)
RDW: 15.7 % — ABNORMAL HIGH (ref 11.5–15.5)
WBC: 17.8 10*3/uL — ABNORMAL HIGH (ref 4.0–10.5)

## 2014-03-06 LAB — CBC
HCT: 19.3 % — ABNORMAL LOW (ref 39.0–52.0)
Hemoglobin: 6.5 g/dL — CL (ref 13.0–17.0)
MCH: 27.1 pg (ref 26.0–34.0)
MCHC: 33.7 g/dL (ref 30.0–36.0)
MCV: 80.4 fL (ref 78.0–100.0)
Platelets: 127 10*3/uL — ABNORMAL LOW (ref 150–400)
RBC: 2.4 MIL/uL — ABNORMAL LOW (ref 4.22–5.81)
RDW: 16 % — ABNORMAL HIGH (ref 11.5–15.5)
WBC: 12.4 10*3/uL — ABNORMAL HIGH (ref 4.0–10.5)

## 2014-03-06 LAB — GLUCOSE, CAPILLARY
Glucose-Capillary: 151 mg/dL — ABNORMAL HIGH (ref 70–99)
Glucose-Capillary: 137 mg/dL — ABNORMAL HIGH (ref 70–99)
Glucose-Capillary: 165 mg/dL — ABNORMAL HIGH (ref 70–99)
Glucose-Capillary: 138 mg/dL — ABNORMAL HIGH (ref 70–99)
Glucose-Capillary: 154 mg/dL — ABNORMAL HIGH (ref 70–99)

## 2014-03-06 LAB — COMPREHENSIVE METABOLIC PANEL
ALT: 41 U/L (ref 0–53)
AST: 41 U/L — ABNORMAL HIGH (ref 0–37)
Albumin: 2.6 g/dL — ABNORMAL LOW (ref 3.5–5.2)
Alkaline Phosphatase: 70 U/L (ref 39–117)
Anion gap: 10 (ref 5–15)
BUN: 16 mg/dL (ref 6–23)
CO2: 25 mEq/L (ref 19–32)
Calcium: 8 mg/dL — ABNORMAL LOW (ref 8.4–10.5)
Chloride: 102 mEq/L (ref 96–112)
Creatinine, Ser: 0.98 mg/dL (ref 0.50–1.35)
GFR calc Af Amer: >90 mL/min (ref >90)
GFR calc non Af Amer: 80 mL/min — ABNORMAL LOW (ref >90)
Glucose, Bld: 163 mg/dL — ABNORMAL HIGH (ref 70–99)
Potassium: 4.6 mEq/L (ref 3.7–5.3)
Sodium: 137 mEq/L (ref 137–147)
Total Bilirubin: 0.3 mg/dL (ref 0.3–1.2)
Total Protein: 5.5 g/dL — ABNORMAL LOW (ref 6.0–8.3)

## 2014-03-06 LAB — PREPARE RBC (CROSSMATCH)

## 2014-03-06 MED ORDER — SODIUM CHLORIDE 0.9 % IV SOLN: Freq: Once | INTRAVENOUS | Status: DC

## 2014-03-06 NOTE — Progress Notes (Signed)
General Surgery Note  LOS: 2 days  POD -  2 Days Post-Op GI - P. [place]   Assessment/Plan: 1.  DIAGNOSTIC LAPAROSCOPY, PANCREATICODUODENECTOMY, REMOVAL OF BILIARY STENT, PLACEMENT OF PANCREATIC DUCT STENT HERNIA REPAIR UMBILICAL ADULT - 87/68/1157 Peter Summers  For Pancreatic ca  NGT with little out - will d/c  Seems to be doing okay.  To ambulate more.  Will keep in ICU because of drop of hgb.  2.  Anemia - hgb - 6.5 - 03/06/2014 - no obvious source of blood loss.  Drop from 9.1 - 03/05/2014  Transfused one unit and recheck labs 3.  HTN 4.  DM  5.  DVT prophylaxis - SQ Heparin - will hold until hgb stabilizes.   Active Problems:   Adenocarcinoma of head of pancreas  Subjective:  Feels okay.  No complaint  He is from the [place].  Objective:   Filed Vitals:   03/06/14 0605  BP: 100/61  Pulse:   Temp:   Resp: 16     Intake/Output from previous day:  10/30 0701 - 10/31 0700 In: 2867.5 [I.V.:2367.5; IV Piggyback:500] Out: 2620 [Urine:1565; Emesis/NG output:60; Drains:210]  Intake/Output this shift:      Physical Exam:   General: WN AA M who is alert and oriented.    HEENT: Normal. Pupils equal. .   Lungs: Clear.  IS - 1,000 cc   Abdomen: Soft.  Quiet.   Wound: Has On Q pump.  2 drains - 1/2 - 60/150 cc last 24 hours.   Lab Results:    Recent Labs  03/05/14 0540 03/06/14 0335  WBC 14.4* 12.4*  HGB 9.1* 6.5*  HCT 26.5* 19.3*  PLT 212 127*    BMET   Recent Labs  03/05/14 0540 03/06/14 0335  NA 138 137  K 5.1 4.6  CL 102 102  CO2 25 25  GLUCOSE 224* 163*  BUN 21 16  CREATININE 1.16 0.98  CALCIUM 8.2* 8.0*    PT/INR   Recent Labs  03/05/14 0540  LABPROT 17.6*  INR 1.43    ABG  No results found for this basename: PHART, PCO2, PO2, HCO3,  in the last 72 hours   Studies/Results:  No results found.   Anti-infectives:   Anti-infectives   Start     Dose/Rate Route Frequency Ordered Stop   03/04/14 1800  cefOXitin (MEFOXIN) 1  g in dextrose 5 % 50 mL IVPB     1 g 100 mL/hr over 30 Minutes Intravenous Every 6 hours 03/04/14 1614 03/05/14 0615   03/04/14 1200  ceFAZolin (ANCEF) IVPB 2 g/50 mL premix     2 g 100 mL/hr over 30 Minutes Intravenous  Once 03/04/14 1157 03/04/14 1157   03/04/14 0519  ceFAZolin (ANCEF) IVPB 2 g/50 mL premix     2 g 100 mL/hr over 30 Minutes Intravenous On call to O.R. 03/04/14 0519 03/04/14 0748      [name], MD, FACS Pager: [place] [facility] Office: [phone] 03/06/2014

## 2014-03-06 NOTE — Progress Notes (Signed)
Wasted 3mg  pca ms in sink Che Rachal rn and scott crofts rn

## 2014-03-07 LAB — MRSA CULTURE

## 2014-03-07 LAB — CBC
HCT: 21.1 % — ABNORMAL LOW (ref 39.0–52.0)
Hemoglobin: 7.2 g/dL — ABNORMAL LOW (ref 13.0–17.0)
MCH: 27.8 pg (ref 26.0–34.0)
MCHC: 33.6 g/dL (ref 30.0–36.0)
MCV: 82.7 fL (ref 78.0–100.0)
Platelets: 145 10*3/uL — ABNORMAL LOW (ref 150–400)
RBC: 2.55 MIL/uL — ABNORMAL LOW (ref 4.22–5.81)
RDW: 16 % — ABNORMAL HIGH (ref 11.5–15.5)
WBC: 11.9 10*3/uL — ABNORMAL HIGH (ref 4.0–10.5)

## 2014-03-07 LAB — GLUCOSE, CAPILLARY
Glucose-Capillary: 168 mg/dL — ABNORMAL HIGH (ref 70–99)
Glucose-Capillary: 174 mg/dL — ABNORMAL HIGH (ref 70–99)
Glucose-Capillary: 176 mg/dL — ABNORMAL HIGH (ref 70–99)
Glucose-Capillary: 183 mg/dL — ABNORMAL HIGH (ref 70–99)

## 2014-03-07 LAB — COMPREHENSIVE METABOLIC PANEL
ALT: 56 U/L — ABNORMAL HIGH (ref 0–53)
AST: 45 U/L — ABNORMAL HIGH (ref 0–37)
Albumin: 2.6 g/dL — ABNORMAL LOW (ref 3.5–5.2)
Alkaline Phosphatase: 82 U/L (ref 39–117)
Anion gap: 8 (ref 5–15)
BUN: 8 mg/dL (ref 6–23)
CO2: 26 mEq/L (ref 19–32)
Calcium: 8.6 mg/dL (ref 8.4–10.5)
Chloride: 103 mEq/L (ref 96–112)
Creatinine, Ser: 0.84 mg/dL (ref 0.50–1.35)
GFR calc Af Amer: >90 mL/min (ref >90)
GFR calc non Af Amer: 85 mL/min — ABNORMAL LOW (ref >90)
Glucose, Bld: 180 mg/dL — ABNORMAL HIGH (ref 70–99)
Potassium: 4.1 mEq/L (ref 3.7–5.3)
Sodium: 137 mEq/L (ref 137–147)
Total Bilirubin: 0.5 mg/dL (ref 0.3–1.2)
Total Protein: 5.6 g/dL — ABNORMAL LOW (ref 6.0–8.3)

## 2014-03-07 MED ORDER — HYDROMORPHONE 0.3 MG/ML IV SOLN
INTRAVENOUS | Status: DC
  Administered 2014-03-07: 0.799 mg via INTRAVENOUS
  Administered 2014-03-07: 09:00:00 via INTRAVENOUS
  Administered 2014-03-07: 4.85 mg via INTRAVENOUS
  Administered 2014-03-08: 1.59 mg via INTRAVENOUS
  Administered 2014-03-08: 2.39 mg via INTRAVENOUS
  Administered 2014-03-08: 0.999 mg via INTRAVENOUS
  Filled 2014-03-07 (×2): qty 25

## 2014-03-07 NOTE — Plan of Care (Signed)
Problem: Phase I Progression Outcomes Goal: Pain controlled with appropriate interventions Outcome: Completed/Met Date Met:  03/07/14 Goal: OOB as tolerated unless otherwise ordered Outcome: Progressing Pt able to sit up in chair all afternoon. Goal: Incision/dressings dry and intact Outcome: Progressing Pencil eraser sized opening under one staple.  Rest of incision with staples, OTA. Goal: Sutures/staples intact Outcome: Completed/Met Date Met:  03/07/14 Goal: Voiding-avoid urinary catheter unless indicated Outcome: Completed/Met Date Met:  03/07/14 Goal: Vital signs/hemodynamically stable Outcome: Not Progressing Tachy, HTN  Problem: Phase II Progression Outcomes Goal: Progressing with IS, TCDB Outcome: Completed/Met Date Met:  03/07/14 Goal: Foley discontinued Outcome: Completed/Met Date Met:  03/07/14     

## 2014-03-07 NOTE — Plan of Care (Signed)
Problem: Phase I Progression Outcomes Goal: Pain controlled with appropriate interventions Outcome: Progressing Goal: Tubes/drains patent Outcome: Completed/Met Date Met:  03/07/14

## 2014-03-07 NOTE — Progress Notes (Signed)
General Surgery Note  LOS: 3 days  POD -  3 Days Post-Op GI - P. [place]   Assessment/Plan: 1.  DIAGNOSTIC LAPAROSCOPY, PANCREATICODUODENECTOMY, REMOVAL OF BILIARY STENT, PLACEMENT OF PANCREATIC DUCT STENT HERNIA REPAIR UMBILICAL ADULT - 32/44/0102 Peter Summers  For Pancreatic ca  Tolerated NG out - will continue NPO  To transfer out of unit  2.  Anemia - hgb - 7.2 - 03/07/2014 - post one unit blood transfusion  Drop from 9.1 - 03/05/2014  "stable" - to repeat in AM 3.  HTN 4.  DM  5.  DVT prophylaxis - SQ Heparin - will hold until hgb stabilizes. 6.  Some hallucination with PCA morphine - will switch to PCA dilaudid   Active Problems:   Adenocarcinoma of head of pancreas  Subjective:  Some hallucinations.  Wants to change pain med.  No nausea or vomiting.  Wife in room.  He is from the [place].  Objective:   Filed Vitals:   03/07/14 0600  BP: 134/63  Pulse: 103  Temp:   Resp: 18     Intake/Output from previous day:  10/31 0701 - 11/01 0700 In: 2792.5 [I.V.:2412.5; Blood:380] Out: 1990 [VOZDG:6440; Emesis/NG output:75; Drains:230]  Intake/Output this shift:      Physical Exam:   General: WN AA M who is alert and oriented.    HEENT: Normal. Pupils equal. .   Lungs: Clear.  IS - 800 cc   Abdomen: Soft.  Quiet.   Wound: Q pump.out - removed tubing.  2 drains - 1/2 - 85/145 cc last 24 hours.   Lab Results:     Recent Labs  03/06/14 1656 03/07/14 0345  WBC 17.8* 11.9*  HGB 9.8* 7.2*  HCT 29.7* 21.1*  PLT 156 145*    BMET    Recent Labs  03/06/14 0335 03/07/14 0345  NA 137 137  K 4.6 4.1  CL 102 103  CO2 25 26  GLUCOSE 163* 180*  BUN 16 8  CREATININE 0.98 0.84  CALCIUM 8.0* 8.6    PT/INR    Recent Labs  03/05/14 0540  LABPROT 17.6*  INR 1.43    ABG  No results for input(s): PHART, HCO3 in the last 72 hours.  Invalid input(s): PCO2, PO2   Studies/Results:  No results found.   Anti-infectives:   Anti-infectives    Start     Dose/Rate Route Frequency Ordered Stop   03/04/14 1800  cefOXitin (MEFOXIN) 1 g in dextrose 5 % 50 mL IVPB     1 g100 mL/hr over 30 Minutes Intravenous Every 6 hours 03/04/14 1614 03/05/14 0615   03/04/14 1200  ceFAZolin (ANCEF) IVPB 2 g/50 mL premix     2 g100 mL/hr over 30 Minutes Intravenous  Once 03/04/14 1157 03/04/14 1157   03/04/14 0519  ceFAZolin (ANCEF) IVPB 2 g/50 mL premix     2 g100 mL/hr over 30 Minutes Intravenous On call to O.R. 03/04/14 0519 03/04/14 0748      [name], MD, FACS Pager: [place] [facility]: [phone] 03/07/2014

## 2014-03-08 ENCOUNTER — Encounter (HOSPITAL_COMMUNITY): Payer: Self-pay | Admitting: General Surgery

## 2014-03-08 LAB — CBC
HCT: 21.9 % — ABNORMAL LOW (ref 39.0–52.0)
Hemoglobin: 7.4 g/dL — ABNORMAL LOW (ref 13.0–17.0)
MCH: 28.5 pg (ref 26.0–34.0)
MCHC: 33.8 g/dL (ref 30.0–36.0)
MCV: 84.2 fL (ref 78.0–100.0)
Platelets: 182 10*3/uL (ref 150–400)
RBC: 2.6 MIL/uL — ABNORMAL LOW (ref 4.22–5.81)
RDW: 16.2 % — ABNORMAL HIGH (ref 11.5–15.5)
WBC: 9.5 10*3/uL (ref 4.0–10.5)

## 2014-03-08 LAB — GLUCOSE, CAPILLARY
Glucose-Capillary: 149 mg/dL — ABNORMAL HIGH (ref 70–99)
Glucose-Capillary: 159 mg/dL — ABNORMAL HIGH (ref 70–99)
Glucose-Capillary: 165 mg/dL — ABNORMAL HIGH (ref 70–99)
Glucose-Capillary: 169 mg/dL — ABNORMAL HIGH (ref 70–99)
Glucose-Capillary: 185 mg/dL — ABNORMAL HIGH (ref 70–99)
Glucose-Capillary: 204 mg/dL — ABNORMAL HIGH (ref 70–99)
Glucose-Capillary: 216 mg/dL — ABNORMAL HIGH (ref 70–99)
Glucose-Capillary: 90 mg/dL (ref 70–99)

## 2014-03-08 LAB — TYPE AND SCREEN
ABO/RH(D): A POS
Antibody Screen: NEGATIVE
Unit division: 0
Unit division: 0
Unit division: 0
Unit division: 0

## 2014-03-08 LAB — COMPREHENSIVE METABOLIC PANEL
ALT: 57 U/L — ABNORMAL HIGH (ref 0–53)
AST: 33 U/L (ref 0–37)
Albumin: 2.8 g/dL — ABNORMAL LOW (ref 3.5–5.2)
Alkaline Phosphatase: 93 U/L (ref 39–117)
Anion gap: 11 (ref 5–15)
BUN: 10 mg/dL (ref 6–23)
CO2: 26 mEq/L (ref 19–32)
Calcium: 8.6 mg/dL (ref 8.4–10.5)
Chloride: 102 mEq/L (ref 96–112)
Creatinine, Ser: 0.87 mg/dL (ref 0.50–1.35)
GFR calc Af Amer: >90 mL/min (ref >90)
GFR calc non Af Amer: 84 mL/min — ABNORMAL LOW (ref >90)
Glucose, Bld: 202 mg/dL — ABNORMAL HIGH (ref 70–99)
Potassium: 4.3 mEq/L (ref 3.7–5.3)
Sodium: 139 mEq/L (ref 137–147)
Total Bilirubin: 0.5 mg/dL (ref 0.3–1.2)
Total Protein: 6.4 g/dL (ref 6.0–8.3)

## 2014-03-08 MED ORDER — NALOXONE HCL 0.4 MG/ML IJ SOLN: 0.4000 mg | INTRAMUSCULAR | Status: DC | PRN

## 2014-03-08 MED ORDER — HYDROMORPHONE HCL 1 MG/ML IJ SOLN: 0.5000 mg | INTRAMUSCULAR | Status: DC | PRN

## 2014-03-08 MED ORDER — ONDANSETRON HCL 4 MG/2ML IJ SOLN
4.0000 mg | Freq: Four times a day (QID) | INTRAMUSCULAR | Status: DC | PRN
Start: 2014-03-08 — End: 2014-03-15

## 2014-03-08 MED ORDER — DIPHENHYDRAMINE HCL 12.5 MG/5ML PO ELIX: 12.5000 mg | ORAL_SOLUTION | Freq: Four times a day (QID) | ORAL | Status: DC | PRN

## 2014-03-08 MED ORDER — SODIUM CHLORIDE 0.9 % IJ SOLN: 9.0000 mL | INTRAMUSCULAR | Status: DC | PRN

## 2014-03-08 MED ORDER — OXYCODONE-ACETAMINOPHEN 5-325 MG PO TABS
1.0000 | ORAL_TABLET | ORAL | Status: DC | PRN
  Administered 2014-03-08: 2 via ORAL
  Administered 2014-03-10 – 2014-03-11 (×5): 1 via ORAL
  Administered 2014-03-12: 2 via ORAL
  Administered 2014-03-12 – 2014-03-13 (×2): 1 via ORAL
  Administered 2014-03-13: 2 via ORAL
  Administered 2014-03-13: 1 via ORAL
  Administered 2014-03-13 – 2014-03-15 (×4): 2 via ORAL
  Filled 2014-03-08 (×5): qty 2
  Filled 2014-03-08: qty 1
  Filled 2014-03-08: qty 2
  Filled 2014-03-08 (×2): qty 1

## 2014-03-08 MED ORDER — CLONAZEPAM 1 MG PO TABS
1.0000 mg | ORAL_TABLET | Freq: Two times a day (BID) | ORAL | Status: DC
  Administered 2014-03-08: 1 mg via ORAL
  Filled 2014-03-08: qty 1

## 2014-03-08 MED ORDER — DIPHENHYDRAMINE HCL 50 MG/ML IJ SOLN: 12.5000 mg | Freq: Four times a day (QID) | INTRAMUSCULAR | Status: DC | PRN

## 2014-03-08 NOTE — Plan of Care (Signed)
Problem: Phase I Progression Outcomes Goal: OOB as tolerated unless otherwise ordered Outcome: Completed/Met Date Met:  03/08/14 Goal: Incision/dressings dry and intact Outcome: Completed/Met Date Met:  03/08/14 Goal: Initial discharge plan identified Outcome: Completed/Met Date Met:  03/08/14 Goal: Vital signs/hemodynamically stable Outcome: Completed/Met Date Met:  03/08/14 Goal: Other Phase I Outcomes/Goals Outcome: Completed/Met Date Met:  03/08/14  Problem: Phase II Progression Outcomes Goal: Pain controlled Outcome: Completed/Met Date Met:  03/08/14 Goal: Progress activity as tolerated unless otherwise ordered Outcome: Completed/Met Date Met:  03/08/14 Goal: Vital signs stable Outcome: Progressing Goal: Surgical site without signs of infection Outcome: Progressing

## 2014-03-08 NOTE — Plan of Care (Signed)
Problem: Phase II Progression Outcomes Goal: Vital signs stable Outcome: Progressing Goal: Surgical site without signs of infection Outcome: Completed/Met Date Met:  03/08/14 Goal: Dressings dry/intact Outcome: Completed/Met Date Met:  03/08/14

## 2014-03-08 NOTE — Progress Notes (Signed)
Patient ID: Peter Summers, male   DOB: Jul 06, 1940, 73 y.o.   MRN: [id number] 4 Days Post-Op  Subjective: Passing some flatus.  No n/v.  OOB.  Pain control OK.   Objective: Vital signs in last 24 hours: Temp:  [97.4 F (36.3 C)-98.7 F (37.1 C)] 97.4 F (36.3 C) (11/02 0543) Pulse Rate:  [97-112] 104 (11/02 0543) Resp:  [14-25] 17 (11/02 0829) BP: (142-175)/(65-94) 147/79 mmHg (11/02 0543) SpO2:  [95 %-100 %] 100 % (11/02 0829) Last BM Date: 03/03/14  Intake/Output from previous day: 11/01 0701 - 11/02 0700 In: 1940 [I.V.:1800] Out: 1960 [Urine:1700; Drains:260] Intake/Output this shift:    General appearance: alert, cooperative and no distress Resp: breathing comfortably GI: soft, non distended, non tender, drains serosang  Lab Results:   Recent Labs  03/07/14 0345 03/08/14 0438  WBC 11.9* 9.5  HGB 7.2* 7.4*  HCT 21.1* 21.9*  PLT 145* 182   BMET  Recent Labs  03/07/14 0345 03/08/14 0438  NA 137 139  K 4.1 4.3  CL 103 102  CO2 26 26  GLUCOSE 180* 202*  BUN 8 10  CREATININE 0.84 0.87  CALCIUM 8.6 8.6   PT/INR No results for input(s): LABPROT, INR in the last 72 hours. ABG No results for input(s): PHART, HCO3 in the last 72 hours.  Invalid input(s): PCO2, PO2  Studies/Results: No results found.  Anti-infectives: Anti-infectives    Start     Dose/Rate Route Frequency Ordered Stop   03/04/14 1800  cefOXitin (MEFOXIN) 1 g in dextrose 5 % 50 mL IVPB     1 g100 mL/hr over 30 Minutes Intravenous Every 6 hours 03/04/14 1614 03/05/14 0615   03/04/14 1200  ceFAZolin (ANCEF) IVPB 2 g/50 mL premix     2 g100 mL/hr over 30 Minutes Intravenous  Once 03/04/14 1157 03/04/14 1157   03/04/14 0519  ceFAZolin (ANCEF) IVPB 2 g/50 mL premix     2 g100 mL/hr over 30 Minutes Intravenous On call to O.R. 03/04/14 0383 03/04/14 0748      Assessment/Plan: s/p Procedure(s):  DIAGNOSTIC LAPAROSCOPY, PANCREATICODUODENECTOMY, REMOVAL OF BILIARY STENT, PLACEMENT OF  PANCREATIC DUCT STENT (N/A) HERNIA REPAIR UMBILICAL ADULT (N/A) Advance diet.   D/c pca Decrease IVF  Ativan for anxiety. RISS for hyperglycemia Chronic anemia and ABL anemia - stable.   LOS: 4 days    [facility] 03/08/2014

## 2014-03-09 LAB — GLUCOSE, CAPILLARY
Glucose-Capillary: 114 mg/dL — ABNORMAL HIGH (ref 70–99)
Glucose-Capillary: 138 mg/dL — ABNORMAL HIGH (ref 70–99)
Glucose-Capillary: 148 mg/dL — ABNORMAL HIGH (ref 70–99)
Glucose-Capillary: 216 mg/dL — ABNORMAL HIGH (ref 70–99)
Glucose-Capillary: 219 mg/dL — ABNORMAL HIGH (ref 70–99)
Glucose-Capillary: 247 mg/dL — ABNORMAL HIGH (ref 70–99)

## 2014-03-09 LAB — COMPREHENSIVE METABOLIC PANEL
ALT: 40 U/L (ref 0–53)
AST: 22 U/L (ref 0–37)
Albumin: 2.6 g/dL — ABNORMAL LOW (ref 3.5–5.2)
Alkaline Phosphatase: 80 U/L (ref 39–117)
Anion gap: 13 (ref 5–15)
BUN: 9 mg/dL (ref 6–23)
CO2: 25 mEq/L (ref 19–32)
Calcium: 8.4 mg/dL (ref 8.4–10.5)
Chloride: 101 mEq/L (ref 96–112)
Creatinine, Ser: 0.87 mg/dL (ref 0.50–1.35)
GFR calc Af Amer: >90 mL/min (ref >90)
GFR calc non Af Amer: 84 mL/min — ABNORMAL LOW (ref >90)
Glucose, Bld: 171 mg/dL — ABNORMAL HIGH (ref 70–99)
Potassium: 3.7 mEq/L (ref 3.7–5.3)
Sodium: 139 mEq/L (ref 137–147)
Total Bilirubin: 0.4 mg/dL (ref 0.3–1.2)
Total Protein: 5.6 g/dL — ABNORMAL LOW (ref 6.0–8.3)

## 2014-03-09 LAB — CBC
HCT: 21.5 % — ABNORMAL LOW (ref 39.0–52.0)
Hemoglobin: 7.1 g/dL — ABNORMAL LOW (ref 13.0–17.0)
MCH: 27.8 pg (ref 26.0–34.0)
MCHC: 33 g/dL (ref 30.0–36.0)
MCV: 84.3 fL (ref 78.0–100.0)
Platelets: 169 10*3/uL (ref 150–400)
RBC: 2.55 MIL/uL — ABNORMAL LOW (ref 4.22–5.81)
RDW: 16.4 % — ABNORMAL HIGH (ref 11.5–15.5)
WBC: 10.5 10*3/uL (ref 4.0–10.5)

## 2014-03-09 MED ORDER — CLONAZEPAM 0.5 MG PO TABS
0.5000 mg | ORAL_TABLET | Freq: Two times a day (BID) | ORAL | Status: DC
  Administered 2014-03-09 – 2014-03-15 (×13): 0.5 mg via ORAL
  Filled 2014-03-09 (×13): qty 1

## 2014-03-09 NOTE — Plan of Care (Signed)
Problem: Phase II Progression Outcomes Goal: Vital signs stable Outcome: Not Progressing Tachy at times, HTN Goal: Sutures/staples intact Outcome: Completed/Met Date Met:  03/09/14 Goal: Return of bowel function (flatus, BM) IF ABDOMINAL SURGERY:  Outcome: Progressing + flatus, no BM Goal: Discharge plan established Outcome: Completed/Met Date Met:  03/09/14 Goal: Tolerating diet Outcome: Completed/Met Date Met:  03/09/14  Problem: Phase III Progression Outcomes Goal: Voiding independently Outcome: Completed/Met Date Met:  03/09/14 Goal: Nasogastric tube discontinued Outcome: Completed/Met Date Met:  03/09/14 Goal: Discharge plan remains appropriate-arrangements made Outcome: Completed/Met Date Met:  03/09/14

## 2014-03-09 NOTE — Progress Notes (Signed)
  Patient ID: Peter Summers, male   DOB: 08-May-1940, 73 y.o.   MRN: [id number] 5 Days Post-Op  Subjective: Passing lots of gas.  No n/v with full liquids.  Denies pain.   Objective: Vital signs in last 24 hours: Temp:  [98.6 F (37 C)-99.4 F (37.4 C)] 98.6 F (37 C) (11/03 1148) Pulse Rate:  [73-102] 73 (11/03 1148) Resp:  [14-18] 14 (11/03 1148) BP: (130-140)/(66-75) 138/72 mmHg (11/03 1148) SpO2:  [98 %-99 %] 99 % (11/03 0536) Last BM Date: 03/09/14  Intake/Output from previous day: 11/02 0701 - 11/03 0700 In: 2425 [P.O.:660; I.V.:1765] Out: 1960 [Urine:1550; Drains:410] Intake/Output this shift: Total I/O In: 360 [P.O.:360] Out: 470 [Urine:400; Drains:70]  General appearance: alert, cooperative and no distress Resp: breathing comfortably.  Up and around room.   GI: soft, non distended, non tender, drains serosang  Lab Results:   Recent Labs  03/08/14 0438 03/09/14 0550  WBC 9.5 10.5  HGB 7.4* 7.1*  HCT 21.9* 21.5*  PLT 182 169   BMET  Recent Labs  03/08/14 0438 03/09/14 0550  NA 139 139  K 4.3 3.7  CL 102 101  CO2 26 25  GLUCOSE 202* 171*  BUN 10 9  CREATININE 0.87 0.87  CALCIUM 8.6 8.4   PT/INR No results for input(s): LABPROT, INR in the last 72 hours. ABG No results for input(s): PHART, HCO3 in the last 72 hours.  Invalid input(s): PCO2, PO2  Studies/Results: No results found.  Anti-infectives: Anti-infectives    Start     Dose/Rate Route Frequency Ordered Stop   03/04/14 1800  cefOXitin (MEFOXIN) 1 g in dextrose 5 % 50 mL IVPB     1 g100 mL/hr over 30 Minutes Intravenous Every 6 hours 03/04/14 1614 03/05/14 0615   03/04/14 1200  ceFAZolin (ANCEF) IVPB 2 g/50 mL premix     2 g100 mL/hr over 30 Minutes Intravenous  Once 03/04/14 1157 03/04/14 1157   03/04/14 0519  ceFAZolin (ANCEF) IVPB 2 g/50 mL premix     2 g100 mL/hr over 30 Minutes Intravenous On call to O.R. 03/04/14 6063 03/04/14 0748      Assessment/Plan: s/p  Procedure(s):  DIAGNOSTIC LAPAROSCOPY, PANCREATICODUODENECTOMY, REMOVAL OF BILIARY STENT, PLACEMENT OF PANCREATIC DUCT STENT (N/A) HERNIA REPAIR UMBILICAL ADULT (N/A) Advance diet.    Decreased Klonepin today.   RISS for hyperglycemia Chronic anemia and ABL anemia - stable.   LOS: 5 days    [facility] 03/09/2014

## 2014-03-10 LAB — TYPE AND SCREEN
ABO/RH(D): A POS
Antibody Screen: NEGATIVE
Unit division: 0
Unit division: 0

## 2014-03-10 LAB — COMPREHENSIVE METABOLIC PANEL
ALT: 30 U/L (ref 0–53)
AST: 13 U/L (ref 0–37)
Albumin: 2.4 g/dL — ABNORMAL LOW (ref 3.5–5.2)
Alkaline Phosphatase: 89 U/L (ref 39–117)
Anion gap: 12 (ref 5–15)
BUN: 8 mg/dL (ref 6–23)
CO2: 26 mEq/L (ref 19–32)
Calcium: 8.5 mg/dL (ref 8.4–10.5)
Chloride: 102 mEq/L (ref 96–112)
Creatinine, Ser: 0.89 mg/dL (ref 0.50–1.35)
GFR calc Af Amer: >90 mL/min (ref >90)
GFR calc non Af Amer: 83 mL/min — ABNORMAL LOW (ref >90)
Glucose, Bld: 153 mg/dL — ABNORMAL HIGH (ref 70–99)
Potassium: 3.6 mEq/L — ABNORMAL LOW (ref 3.7–5.3)
Sodium: 140 mEq/L (ref 137–147)
Total Bilirubin: 0.4 mg/dL (ref 0.3–1.2)
Total Protein: 5.7 g/dL — ABNORMAL LOW (ref 6.0–8.3)

## 2014-03-10 LAB — GLUCOSE, CAPILLARY
Glucose-Capillary: 135 mg/dL — ABNORMAL HIGH (ref 70–99)
Glucose-Capillary: 135 mg/dL — ABNORMAL HIGH (ref 70–99)
Glucose-Capillary: 147 mg/dL — ABNORMAL HIGH (ref 70–99)
Glucose-Capillary: 156 mg/dL — ABNORMAL HIGH (ref 70–99)
Glucose-Capillary: 221 mg/dL — ABNORMAL HIGH (ref 70–99)
Glucose-Capillary: 228 mg/dL — ABNORMAL HIGH (ref 70–99)
Glucose-Capillary: 268 mg/dL — ABNORMAL HIGH (ref 70–99)

## 2014-03-10 LAB — CBC
HCT: 22 % — ABNORMAL LOW (ref 39.0–52.0)
Hemoglobin: 7.5 g/dL — ABNORMAL LOW (ref 13.0–17.0)
MCH: 28.6 pg (ref 26.0–34.0)
MCHC: 34.1 g/dL (ref 30.0–36.0)
MCV: 84 fL (ref 78.0–100.0)
Platelets: 181 10*3/uL (ref 150–400)
RBC: 2.62 MIL/uL — ABNORMAL LOW (ref 4.22–5.81)
RDW: 16.4 % — ABNORMAL HIGH (ref 11.5–15.5)
WBC: 11.2 10*3/uL — ABNORMAL HIGH (ref 4.0–10.5)

## 2014-03-10 MED ORDER — GLUCERNA SHAKE PO LIQD
237.0000 mL | Freq: Three times a day (TID) | ORAL | Status: DC
  Administered 2014-03-10 – 2014-03-15 (×14): 237 mL via ORAL
  Filled 2014-03-10 (×16): qty 237

## 2014-03-10 MED ORDER — BISACODYL 10 MG RE SUPP
10.0000 mg | Freq: Every day | RECTAL | Status: DC
Start: 2014-03-10 — End: 2014-03-15
  Administered 2014-03-11 – 2014-03-12 (×2): 10 mg via RECTAL
  Filled 2014-03-10 (×5): qty 1

## 2014-03-10 MED ORDER — METFORMIN HCL ER 500 MG PO TB24
500.0000 mg | ORAL_TABLET | Freq: Every day | ORAL | Status: DC
  Filled 2014-03-10 (×2): qty 1

## 2014-03-10 NOTE — Plan of Care (Signed)
Problem: Phase III Progression Outcomes Goal: Pain controlled on oral analgesia Outcome: Completed/Met Date Met:  03/10/14     

## 2014-03-10 NOTE — Plan of Care (Signed)
Problem: Phase II Progression Outcomes Goal: Vital signs stable Outcome: Completed/Met Date Met:  03/10/14     

## 2014-03-10 NOTE — Progress Notes (Signed)
INITIAL NUTRITION ASSESSMENT  DOCUMENTATION CODES Per approved criteria  -Non-severe (moderate) malnutrition in the context of chronic illness  Pt meets criteria for moderate MALNUTRITION in the context of chronic illness as evidenced by energy intake <75% for >1 month and fluid accumulation.  INTERVENTION:  Provide Glucerna Shake po TID, each supplement provides 220 kcal and 10 grams of protein  Encourage PO intake  Calorie Count in progress  RD to continue to monitor  NUTRITION DIAGNOSIS: Increased nutrient (protein) needs related to pancreatic cancer as evidenced by estimated nutritional needs.   Goal: Pt to meet >/= 90% of their estimated nutrition needs   Monitor:  PO and supplemental intake, weight, labs, I/O's  Reason for Assessment: Calorie Count, noted weight loss  Admitting Dx: pancreatic mass  ASSESSMENT: 73 year old male who presents with a pancreatic mass. Symptoms include abdominal pain, diabetes, diarrhea, early satiety and weight loss (10-12 pounds). Symptoms are exacerbated by eating.   s/p Procedure(s):  DIAGNOSTIC LAPAROSCOPY, PANCREATICODUODENECTOMY, REMOVAL OF BILIARY STENT, PLACEMENT OF PANCREATIC DUCT STENT (N/A) HERNIA REPAIR UMBILICAL ADULT   Pt reports weight loss prior to surgery, UBW of 203 lb. Per weight history documentation, pt was 195 lb on 8/28 (5% wt loss x 2 months, this is not significant for time frame).   PO intake: 0%, last night. Pt states that he has no appetite but has always eaten small portions of his meals. For breakfast , pt states he ate some eggs toast with jelly and orange juice.  Lunch tray had been delivered prior to visit, and RD encouraged pt to eat some of his lunch.   Pt would like to receive a nutritional supplement during his stay, RD to order Glucerna shakes TID.  Calorie Count ordered 11/4.  Nutrition focused physical exam shows no sign of depletion of muscle mass or body fat. Pt does present with generalized  edema.  Labs reviewed: Low K Glucose 153  Height: Ht Readings from Last 1 Encounters:  03/04/14 6\' 1"  (1.854 m)    Weight: Wt Readings from Last 1 Encounters:  03/07/14 186 lb 11.7 oz (84.7 kg)    Ideal Body Weight: 184 lb  % Ideal Body Weight: 101%  Wt Readings from Last 10 Encounters:  03/07/14 186 lb 11.7 oz (84.7 kg)  03/01/14 175 lb 14.4 oz (79.788 kg)  01/29/14 185 lb (83.915 kg)  01/01/14 195 lb (88.451 kg)  04/24/13 186 lb (84.369 kg)  04/21/13 186 lb (84.369 kg)    Usual Body Weight: 203 lb  % Usual Body Weight: 92%  BMI:  Body mass index is 24.64 kg/(m^2).  Estimated Nutritional Needs: Kcal: 2200-2400 Protein: 110-120g Fluid: 2.2L/day  Skin: abdominal incision, back incision  Diet Order: Diet Heart  EDUCATION NEEDS: -No education needs identified at this time   Intake/Output Summary (Last 24 hours) at 03/10/14 1040 Last data filed at 03/10/14 1000  Gross per 24 hour  Intake 1599.17 ml  Output   1400 ml  Net 199.17 ml    Last BM: 10/28  Labs:   Recent Labs Lab 03/05/14 0540  03/08/14 0438 03/09/14 0550 03/10/14 0520  NA 138  < > 139 139 140  K 5.1  < > 4.3 3.7 3.6*  CL 102  < > 102 101 102  CO2 25  < > 26 25 26   BUN 21  < > 10 9 8   CREATININE 1.16  < > 0.87 0.87 0.89  CALCIUM 8.2*  < > 8.6 8.4 8.5  MG  1.5  --   --   --   --   PHOS 3.8  --   --   --   --   GLUCOSE 224*  < > 202* 171* 153*  < > = values in this interval not displayed.  CBG (last 3)   Recent Labs  03/10/14 0046 03/10/14 0430 03/10/14 0754  GLUCAP 156* 135* 147*    Scheduled Meds: . sodium chloride   Intravenous Once  . antiseptic oral rinse  7 mL Mouth Rinse q12n4p  . chlorhexidine  15 mL Mouth Rinse BID  . clonazePAM  0.5 mg Oral BID  . insulin aspart  0-9 Units Subcutaneous 6 times per day  . metoprolol  5 mg Intravenous 4 times per day  . pantoprazole (PROTONIX) IV  40 mg Intravenous QHS    Continuous Infusions: . dextrose 5 % and 0.45 % NaCl  with KCl 20 mEq/L Stopped (03/10/14 1021)    Past Medical History  Diagnosis Date  . Hypertension   . Anxiety   . Enlarged prostate   . Diabetes mellitus     on oral medication - no insulin  . Cancer     pancreatic cancer-abdominal pain     Past Surgical History  Procedure Laterality Date  . Hernia repair  1980    hiatal hernia  . Appendectomy    . Rotator cuff repair Right   . Lumbar laminectomy/decompression microdiscectomy N/A 04/24/2013    Procedure: CENTRAL DECOMPRESSIVE LUMBAR LAMINECTOMY L3-4 EXCISION 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 ;  Surgeon: Tobi Bastos, MD;  Location: [facility];  Service: Orthopedics;  Laterality: N/A;  . Eus N/A 01/01/2014    Procedure: UPPER ENDOSCOPIC ULTRASOUND (EUS) LINEAR;  Surgeon: Beryle Beams, MD;  Location: [facility];  Service: Endoscopy;  Laterality: N/A;  . Ercp N/A 01/29/2014    Procedure: ENDOSCOPIC RETROGRADE CHOLANGIOPANCREATOGRAPHY (ERCP);  Surgeon: Beryle Beams, MD;  Location: [facility];  Service: Endoscopy;  Laterality: N/A;  . Pancreatic stent placement N/A 01/29/2014    Procedure: PANCREATIC STENT PLACEMENT;  Surgeon: Beryle Beams, MD;  Location: [facility];  Service: Endoscopy;  Laterality: N/A;  . Whipple procedure N/A 03/04/2014    Procedure:  DIAGNOSTIC LAPAROSCOPY, PANCREATICODUODENECTOMY, REMOVAL OF BILIARY STENT, PLACEMENT OF PANCREATIC DUCT STENT;  Surgeon: [name], MD;  Location: [facility];  Service: General;  Laterality: N/A;  . Umbilical hernia repair N/A 03/04/2014    Procedure: HERNIA REPAIR UMBILICAL ADULT;  Surgeon: [name], MD;  Location: [facility];  Service: General;  Laterality: N/A;    Clayton Bibles, MS, RD, LDN Pager: [phone] After Hours Pager: [phone]

## 2014-03-10 NOTE — Progress Notes (Signed)
  Patient ID: Peter Summers, male   DOB: [birth year], 73 y.o.   MRN: [id number] 6 Days Post-Op  Subjective: Continues to have a lot of flatus.  Just had "very good bowel movement."  Ate around 60% of breakfast.    Objective: Vital signs in last 24 hours: Temp:  [99.8 F (37.7 C)-100 F (37.8 C)] 100 F (37.8 C) (11/04 0518) Pulse Rate:  [89-111] 94 (11/04 0518) Resp:  [16] 16 (11/04 0518) BP: (124-145)/(65-94) 124/65 mmHg (11/04 0518) SpO2:  [100 %] 100 % (11/04 0518) Last BM Date: 03/03/14  Intake/Output from previous day: 11/03 0701 - 11/04 0700 In: 2139.2 [P.O.:1440; I.V.:699.2] Out: 1565 [Urine:1050; Drains:515] Intake/Output this shift: Total I/O In: 20 [I.V.:20] Out: 305 [Urine:200; Drains:105]  General appearance: alert, cooperative and no distress Resp: breathing comfortably.      GI: soft, non distended, non tender, drains serosang.  Wound without erythema or drainage.    Lab Results:   Recent Labs  03/09/14 0550 03/10/14 0520  WBC 10.5 11.2*  HGB 7.1* 7.5*  HCT 21.5* 22.0*  PLT 169 181   BMET  Recent Labs  03/09/14 0550 03/10/14 0520  NA 139 140  K 3.7 3.6*  CL 101 102  CO2 25 26  GLUCOSE 171* 153*  BUN 9 8  CREATININE 0.87 0.89  CALCIUM 8.4 8.5   PT/INR No results for input(s): LABPROT, INR in the last 72 hours. ABG No results for input(s): PHART, HCO3 in the last 72 hours.  Invalid input(s): PCO2, PO2  Studies/Results: No results found.  Anti-infectives: Anti-infectives    Start     Dose/Rate Route Frequency Ordered Stop   03/04/14 1800  cefOXitin (MEFOXIN) 1 g in dextrose 5 % 50 mL IVPB     1 g100 mL/hr over 30 Minutes Intravenous Every 6 hours 03/04/14 1614 03/05/14 0615   03/04/14 1200  ceFAZolin (ANCEF) IVPB 2 g/50 mL premix     2 g100 mL/hr over 30 Minutes Intravenous  Once 03/04/14 1157 03/04/14 1157   03/04/14 0519  ceFAZolin (ANCEF) IVPB 2 g/50 mL premix     2 g100 mL/hr over 30 Minutes Intravenous On call to O.R.  03/04/14 6378 03/04/14 0748      Assessment/Plan: s/p Procedure(s):  DIAGNOSTIC LAPAROSCOPY, PANCREATICODUODENECTOMY, REMOVAL OF BILIARY STENT, PLACEMENT OF PANCREATIC DUCT STENT (N/A) HERNIA REPAIR UMBILICAL ADULT (N/A) Calorie counts. D/C drain #1. Restart metformin for DM.  Continue RISS. May be ready to go home in the next few days.    Chronic anemia and ABL anemia - stable.   LOS: 6 days    [facility] 03/10/2014

## 2014-03-11 DIAGNOSIS — E440 Moderate protein-calorie malnutrition: Secondary | ICD-10-CM | POA: Insufficient documentation

## 2014-03-11 LAB — COMPREHENSIVE METABOLIC PANEL
ALT: 24 U/L (ref 0–53)
AST: 14 U/L (ref 0–37)
Albumin: 2.2 g/dL — ABNORMAL LOW (ref 3.5–5.2)
Alkaline Phosphatase: 79 U/L (ref 39–117)
Anion gap: 12 (ref 5–15)
BUN: 9 mg/dL (ref 6–23)
CO2: 24 mEq/L (ref 19–32)
Calcium: 8.5 mg/dL (ref 8.4–10.5)
Chloride: 102 mEq/L (ref 96–112)
Creatinine, Ser: 0.87 mg/dL (ref 0.50–1.35)
GFR calc Af Amer: >90 mL/min (ref >90)
GFR calc non Af Amer: 84 mL/min — ABNORMAL LOW (ref >90)
Glucose, Bld: 146 mg/dL — ABNORMAL HIGH (ref 70–99)
Potassium: 3.7 mEq/L (ref 3.7–5.3)
Sodium: 138 mEq/L (ref 137–147)
Total Bilirubin: 0.3 mg/dL (ref 0.3–1.2)
Total Protein: 5.5 g/dL — ABNORMAL LOW (ref 6.0–8.3)

## 2014-03-11 LAB — GLUCOSE, CAPILLARY
Glucose-Capillary: 132 mg/dL — ABNORMAL HIGH (ref 70–99)
Glucose-Capillary: 165 mg/dL — ABNORMAL HIGH (ref 70–99)
Glucose-Capillary: 225 mg/dL — ABNORMAL HIGH (ref 70–99)
Glucose-Capillary: 281 mg/dL — ABNORMAL HIGH (ref 70–99)
Glucose-Capillary: 220 mg/dL — ABNORMAL HIGH (ref 70–99)

## 2014-03-11 LAB — CBC
HCT: 21.6 % — ABNORMAL LOW (ref 39.0–52.0)
Hemoglobin: 7.2 g/dL — ABNORMAL LOW (ref 13.0–17.0)
MCH: 27.6 pg (ref 26.0–34.0)
MCHC: 33.3 g/dL (ref 30.0–36.0)
MCV: 82.8 fL (ref 78.0–100.0)
Platelets: 233 10*3/uL (ref 150–400)
RBC: 2.61 MIL/uL — ABNORMAL LOW (ref 4.22–5.81)
RDW: 16.5 % — ABNORMAL HIGH (ref 11.5–15.5)
WBC: 9.1 10*3/uL (ref 4.0–10.5)

## 2014-03-11 MED ORDER — FINASTERIDE 5 MG PO TABS
5.0000 mg | ORAL_TABLET | Freq: Every day | ORAL | Status: DC
  Administered 2014-03-11 – 2014-03-15 (×5): 5 mg via ORAL
  Filled 2014-03-11 (×5): qty 1

## 2014-03-11 MED ORDER — HYDROCHLOROTHIAZIDE 12.5 MG PO CAPS
12.5000 mg | ORAL_CAPSULE | Freq: Every day | ORAL | Status: DC
  Administered 2014-03-11 – 2014-03-15 (×5): 12.5 mg via ORAL
  Filled 2014-03-11 (×5): qty 1

## 2014-03-11 MED ORDER — ADULT MULTIVITAMIN W/MINERALS CH
1.0000 | ORAL_TABLET | Freq: Every day | ORAL | Status: DC
  Administered 2014-03-11 – 2014-03-15 (×5): 1 via ORAL
  Filled 2014-03-11 (×5): qty 1

## 2014-03-11 MED ORDER — FERROUS FUMARATE 325 (106 FE) MG PO TABS
1.0000 | ORAL_TABLET | Freq: Every day | ORAL | Status: DC
  Administered 2014-03-11 – 2014-03-15 (×5): 106 mg via ORAL
  Filled 2014-03-11 (×6): qty 1

## 2014-03-11 MED ORDER — LISINOPRIL-HYDROCHLOROTHIAZIDE 20-12.5 MG PO TABS: 1.0000 | ORAL_TABLET | Freq: Every morning | ORAL | Status: DC

## 2014-03-11 MED ORDER — METFORMIN HCL 850 MG PO TABS
850.0000 mg | ORAL_TABLET | Freq: Two times a day (BID) | ORAL | Status: DC
  Administered 2014-03-11 – 2014-03-15 (×9): 850 mg via ORAL
  Filled 2014-03-11 (×12): qty 1

## 2014-03-11 MED ORDER — LISINOPRIL 20 MG PO TABS
20.0000 mg | ORAL_TABLET | Freq: Every day | ORAL | Status: DC
  Administered 2014-03-11 – 2014-03-15 (×5): 20 mg via ORAL
  Filled 2014-03-11 (×5): qty 1

## 2014-03-11 MED ORDER — OXYCODONE-ACETAMINOPHEN 5-325 MG PO TABS
1.0000 | ORAL_TABLET | Freq: Three times a day (TID) | ORAL | Status: DC | PRN
  Filled 2014-03-11: qty 2
  Filled 2014-03-11: qty 1
  Filled 2014-03-11 (×2): qty 2
  Filled 2014-03-11 (×2): qty 1

## 2014-03-11 NOTE — Progress Notes (Signed)
  Patient ID: JASTIN FORE, male   DOB: [birth year], 73 y.o.   MRN: [id number] 7 Days Post-Op  Subjective: Continues to have a lot of flatus.  Started metformin yesterday, still having some hyperglycemia.   One drain removed without incident yesterday.    Objective: Vital signs in last 24 hours: Temp:  [98 F (36.7 C)-99.2 F (37.3 C)] 98.9 F (37.2 C) (11/05 0545) Pulse Rate:  [84-87] 85 (11/05 0545) Resp:  [16-18] 18 (11/05 0545) BP: (121-143)/(57-77) 121/57 mmHg (11/05 0545) SpO2:  [96 %-100 %] 99 % (11/05 0545) Last BM Date: 03/10/14  Intake/Output from previous day: 11/04 0701 - 11/05 0700 In: 380 [P.O.:360; I.V.:20] Out: 1315 [Urine:900; Drains:415] Intake/Output this shift: Total I/O In: 10 [Other:10] Out: 0   General appearance: alert, cooperative and no distress Resp: breathing comfortably.     GI: soft, non distended, non tender, drain serosang.  Wound without erythema or drainage.    Lab Results:   Recent Labs  03/10/14 0520 03/11/14 0453  WBC 11.2* 9.1  HGB 7.5* 7.2*  HCT 22.0* 21.6*  PLT 181 233   BMET  Recent Labs  03/10/14 0520 03/11/14 0453  NA 140 138  K 3.6* 3.7  CL 102 102  CO2 26 24  GLUCOSE 153* 146*  BUN 8 9  CREATININE 0.89 0.87  CALCIUM 8.5 8.5   PT/INR No results for input(s): LABPROT, INR in the last 72 hours. ABG No results for input(s): PHART, HCO3 in the last 72 hours.  Invalid input(s): PCO2, PO2  Studies/Results: No results found.  Anti-infectives: Anti-infectives    Start     Dose/Rate Route Frequency Ordered Stop   03/04/14 1800  cefOXitin (MEFOXIN) 1 g in dextrose 5 % 50 mL IVPB     1 g100 mL/hr over 30 Minutes Intravenous Every 6 hours 03/04/14 1614 03/05/14 0615   03/04/14 1200  ceFAZolin (ANCEF) IVPB 2 g/50 mL premix     2 g100 mL/hr over 30 Minutes Intravenous  Once 03/04/14 1157 03/04/14 1157   03/04/14 0519  ceFAZolin (ANCEF) IVPB 2 g/50 mL premix     2 g100 mL/hr over 30 Minutes Intravenous On call  to O.R. 03/04/14 5366 03/04/14 0748      Assessment/Plan: s/p Procedure(s):  DIAGNOSTIC LAPAROSCOPY, PANCREATICODUODENECTOMY, REMOVAL OF BILIARY STENT, PLACEMENT OF PANCREATIC DUCT STENT (N/A) HERNIA REPAIR UMBILICAL ADULT (N/A) Calorie counts. D/C remaining drain. Increase metformin. See how blood sugars are in the next few days.   May be ready to go home tomorrow.    Chronic anemia and ABL anemia - stable.   LOS: 7 days    [facility] 03/11/2014

## 2014-03-11 NOTE — Plan of Care (Signed)
Problem: Phase II Progression Outcomes Goal: Return of bowel function (flatus, BM) IF ABDOMINAL SURGERY:  Outcome: Completed/Met Date Met:  03/11/14 Goal: Other Phase II Outcomes/Goals Outcome: Completed/Met Date Met:  03/11/14  Problem: Phase III Progression Outcomes Goal: Activity at appropriate level-compared to baseline (UP IN CHAIR FOR HEMODIALYSIS)  Outcome: Completed/Met Date Met:  03/11/14 Goal: IV changed to normal saline lock Outcome: Completed/Met Date Met:  03/11/14 Goal: Demonstrates TCDB, IS independently Outcome: Completed/Met Date Met:  03/11/14 Goal: Other Phase III Outcomes/Goals Outcome: Completed/Met Date Met:  03/11/14

## 2014-03-11 NOTE — Plan of Care (Signed)
Problem: Discharge Progression Outcomes Goal: Pain controlled with appropriate interventions Outcome: Completed/Met Date Met:  03/11/14 Goal: Tolerating diet Outcome: Completed/Met Date Met:  03/11/14

## 2014-03-12 LAB — GLUCOSE, CAPILLARY
Glucose-Capillary: 123 mg/dL — ABNORMAL HIGH (ref 70–99)
Glucose-Capillary: 148 mg/dL — ABNORMAL HIGH (ref 70–99)
Glucose-Capillary: 160 mg/dL — ABNORMAL HIGH (ref 70–99)
Glucose-Capillary: 169 mg/dL — ABNORMAL HIGH (ref 70–99)
Glucose-Capillary: 198 mg/dL — ABNORMAL HIGH (ref 70–99)
Glucose-Capillary: 212 mg/dL — ABNORMAL HIGH (ref 70–99)
Glucose-Capillary: 225 mg/dL — ABNORMAL HIGH (ref 70–99)

## 2014-03-12 MED ORDER — PANTOPRAZOLE SODIUM 40 MG PO TBEC
40.0000 mg | DELAYED_RELEASE_TABLET | Freq: Every day | ORAL | Status: DC
  Administered 2014-03-12 – 2014-03-14 (×3): 40 mg via ORAL
  Filled 2014-03-12 (×4): qty 1

## 2014-03-12 MED ORDER — GLUCERNA SHAKE PO LIQD: 237.0000 mL | Freq: Three times a day (TID) | ORAL | Status: AC

## 2014-03-12 MED ORDER — METFORMIN HCL 850 MG PO TABS: 850.0000 mg | ORAL_TABLET | Freq: Two times a day (BID) | ORAL | Status: DC

## 2014-03-12 MED ORDER — OXYCODONE-ACETAMINOPHEN 5-325 MG PO TABS: 1.0000 | ORAL_TABLET | Freq: Three times a day (TID) | ORAL | Status: DC | PRN

## 2014-03-12 MED ORDER — ESOMEPRAZOLE MAGNESIUM 40 MG PO CPDR: 40.0000 mg | DELAYED_RELEASE_CAPSULE | Freq: Every day | ORAL | Status: AC

## 2014-03-12 NOTE — Progress Notes (Signed)
  Patient ID: Peter Summers, male   DOB: 03-12-1941, 73 y.o.   MRN: [id number] 8 Days Post-Op  Subjective: Doing well except having a lot of leakage from lateral drain site.  Still adjusting to higher dose metformin, but improved blood sugars.    Objective: Vital signs in last 24 hours: Temp:  [98.2 F (36.8 C)-98.6 F (37 C)] 98.2 F (36.8 C) (11/06 0500) Pulse Rate:  [89-92] 92 (11/06 0500) Resp:  [16-18] 18 (11/06 0500) BP: (121-136)/(56-72) 122/56 mmHg (11/06 0500) SpO2:  [97 %-100 %] 100 % (11/06 0500) Last BM Date: 03/10/14  Intake/Output from previous day: 11/05 0701 - 11/06 0700 In: 130 [P.O.:120] Out: 2000 [Urine:2000] Intake/Output this shift:    General appearance: alert, cooperative and no distress Resp: breathing comfortably.     GI: soft, non distended, non tender.  Wound without erythema or drainage.    Lab Results:   Recent Labs  03/10/14 0520 03/11/14 0453  WBC 11.2* 9.1  HGB 7.5* 7.2*  HCT 22.0* 21.6*  PLT 181 233   BMET  Recent Labs  03/10/14 0520 03/11/14 0453  NA 140 138  K 3.6* 3.7  CL 102 102  CO2 26 24  GLUCOSE 153* 146*  BUN 8 9  CREATININE 0.89 0.87  CALCIUM 8.5 8.5   PT/INR No results for input(s): LABPROT, INR in the last 72 hours. ABG No results for input(s): PHART, HCO3 in the last 72 hours.  Invalid input(s): PCO2, PO2  Studies/Results: No results found.  Anti-infectives: Anti-infectives    Start     Dose/Rate Route Frequency Ordered Stop   03/04/14 1800  cefOXitin (MEFOXIN) 1 g in dextrose 5 % 50 mL IVPB     1 g100 mL/hr over 30 Minutes Intravenous Every 6 hours 03/04/14 1614 03/05/14 0615   03/04/14 1200  ceFAZolin (ANCEF) IVPB 2 g/50 mL premix     2 g100 mL/hr over 30 Minutes Intravenous  Once 03/04/14 1157 03/04/14 1157   03/04/14 0519  ceFAZolin (ANCEF) IVPB 2 g/50 mL premix     2 g100 mL/hr over 30 Minutes Intravenous On call to O.R. 03/04/14 1324 03/04/14 0748      Assessment/Plan: s/p  Procedure(s):  DIAGNOSTIC LAPAROSCOPY, PANCREATICODUODENECTOMY, REMOVAL OF BILIARY STENT, PLACEMENT OF PANCREATIC DUCT STENT (N/A) HERNIA REPAIR UMBILICAL ADULT (N/A) Calorie counts. Pathology back.   Work on [employer] calorie counts and increasing mobility. Discharge in AM.   Chronic anemia and ABL anemia - stable.   LOS: 8 days    Keiara Sneeringer 03/12/2014

## 2014-03-12 NOTE — Progress Notes (Addendum)
NUTRITION FOLLOW UP  Intervention:   1.  General healthful diet; encourage intake of foods and beverages as able.  RD to follow and assess for nutritional adequacy.  2.  Supplements; continue Glucerna while hospitalized. Discussed supplement use at discharge as well as alternatives for home supplementation.  3.  Calorie count; discontinue.  Calorie count complete with patient able to demonstrate improvement in intake and overall improvement in pain and comfort while eating.   Nutrition Dx:   Increased nutrient (protein) needs related to pancreatic cancer as evidenced by estimated nutritional needs.   Goal: Pt to meet >/= 90% of their estimated nutrition needs   Monitor:  PO and supplemental intake, weight, labs, I/O's  Assessment:   Pt admitted with pancreatic mass, weight loss, and difficulty eating.  Now s/p pancreaticoduodenectomy.   Pt assessed by RD 11/4 and found to meet criteria for moderate malnutrition of chronic illness. Calorie count was initiated.  Results as follows: 11/4 total documented intake: 880 kcal, 35g protein- no dinner meal or supplements reported, not likely indicative of overall intake.  11/5:  530 kcal, 25g protein at breakfast (only documented meal available for review).  RD met with patient to discuss nutrition progress.  Patient feels that he was able to make improvement to intake over the past few days with today being his most comfortable meal.  Pt reports he ate pancakes, eggs, and sausage with orange juice and tolerated well.  Pt reports that he did have to use the bathroom after the meal.  Patient reports his usual intake at home is: Breakfast:  4 egg whites, toast, bowl of oatmeal, and glass of orange juice Lunch:  6" Roast beef sub from [place] with Diet Coke Dinner:  5-6 drumsticks from [facility].  Pt states he is a "creature of habit" and has eaten the same way x30 years.  He feels he can resume this diet at home.  Patient was very encouraged by discussion  with his doctor this morning.  Pt remains concerned about managing his glucose and asks appropriate questions which are answered.  Discussed ongoing supplement use until patient achieves desired weight.    Height: Ht Readings from Last 1 Encounters:  03/04/14 _0  (1.854 m)    Weight Status:   Wt Readings from Last 1 Encounters:  03/07/14 186 lb 11.7 oz (84.7 kg)    Re-estimated needs:  Kcal: 2200-2400 Protein: 110-120g Fluid: 2.2 L/day  Skin: abdominal incision, back incision  Diet Order: Diet Heart   Intake/Output Summary (Last 24 hours) at 03/12/14 1030 Last data filed at 03/12/14 0935  Gross per 24 hour  Intake    250 ml  Output   2000 ml  Net  -1750 ml    Last BM: 11/6   Labs:   Recent Labs Lab 03/09/14 0550 03/10/14 0520 03/11/14 0453  NA 139 140 138  K 3.7 3.6* 3.7  CL 101 102 102  CO2 _1 BUN _2 CREATININE 0.87 0.89 0.87  CALCIUM 8.4 8.5 8.5  GLUCOSE 171* 153* 146*    CBG (last 3)   Recent Labs  03/12/14 0323 03/12/14 0721 03/12/14 0839  GLUCAP 123* 148* 169*    Scheduled Meds: . sodium chloride   Intravenous Once  . antiseptic oral rinse  7 mL Mouth Rinse q12n4p  . bisacodyl  10 mg Rectal Daily  . chlorhexidine  15 mL Mouth Rinse BID  . clonazePAM  0.5 mg Oral BID  . feeding supplement (  GLUCERNA SHAKE)  237 mL Oral TID BM  . ferrous fumarate  1 tablet Oral Daily  . finasteride  5 mg Oral Daily  . lisinopril  20 mg Oral Daily   And  . hydrochlorothiazide  12.5 mg Oral Daily  . insulin aspart  0-9 Units Subcutaneous 6 times per day  . metFORMIN  850 mg Oral BID WC  . multivitamin with minerals  1 tablet Oral Daily  . pantoprazole  40 mg Oral Q1200    Continuous Infusions:   Brynda Greathouse, MS RD LDN Clinical Inpatient Dietitian Weekend/After hours pager: [phone]

## 2014-03-12 NOTE — Discharge Instructions (Signed)
[facility]      [facility] °[phone] ° °ABDOMINAL SURGERY: POST OP INSTRUCTIONS ° °Always review your discharge instruction sheet given to you by the facility where your surgery was performed. ° °IF YOU HAVE DISABILITY OR FAMILY LEAVE FORMS, YOU MUST BRING THEM TO THE OFFICE FOR PROCESSING.  PLEASE DO NOT GIVE THEM TO YOUR DOCTOR. ° °1. A prescription for pain medication may be given to you upon discharge.  Take your pain medication as prescribed, if needed.  If narcotic pain medicine is not needed, then you may take acetaminophen (Tylenol) or ibuprofen (Advil) as needed. °2. Take your usually prescribed medications unless otherwise directed. °3. If you need a refill on your pain medication, please contact your pharmacy. They will contact our office to request authorization.  Prescriptions will not be filled after 5pm or on week-ends. °4. You should follow a light diet the first few days after arrival home, such as soup and crackers, pudding, etc.unless your doctor has advised otherwise. A high-fiber, low fat diet can be resumed as tolerated.   Be sure to include lots of fluids daily. Most patients will experience some swelling and bruising on the chest and neck area.  Ice packs will help.  Swelling and bruising can take several days to resolve °5. Most patients will experience some swelling and bruising in the area of the incision. Ice pack will help. Swelling and bruising can take several days to resolve..  °6. It is common to experience some constipation if taking pain medication after surgery.  Increasing fluid intake and taking a stool softener will usually help or prevent this problem from occurring.  A mild laxative (Milk of Magnesia or Miralax) should be taken according to package directions if there are no bowel movements after 48 hours. °7.  You may have steri-strips (small skin tapes) in place directly over the incision.  These strips should be left on the skin for 10-14 days.  If your  surgeon used skin glue on the incision, you may shower in 48 hours.  The glue will flake off over the next 2-3 weeks.  Any sutures or staples will be removed at the office during your follow-up visit. You may find that a light gauze bandage over your incision may keep your staples from being rubbed or pulled. You may shower and replace the bandage daily. °8. ACTIVITIES:  You may resume regular (light) daily activities beginning the next day--such as daily self-care, walking, climbing stairs--gradually increasing activities as tolerated.  You may have sexual intercourse when it is comfortable.  Refrain from any heavy lifting or straining until approved by your doctor. °a. You may drive when you no longer are taking prescription pain medication, you can comfortably wear a seatbelt, and you can safely maneuver your car and apply brakes °b. Return to Work: __________8 weeks if applicable_________________________ °9. You should see your doctor in the office for a follow-up appointment approximately two weeks after your surgery.  Make sure that you call for this appointment within a day or two after you arrive home to insure a convenient appointment time. °OTHER INSTRUCTIONS:  °_____________________________________________________________ °_____________________________________________________________ ° °WHEN TO CALL YOUR DOCTOR: °1. Fever over 101.0 °2. Inability to urinate °3. Nausea and/or vomiting °4. Extreme swelling or bruising °5. Continued bleeding from incision. °6. Increased pain, redness, or drainage from the incision. °7. Difficulty swallowing or breathing °8. Muscle cramping or spasms. °9. Numbness or tingling in hands or feet or around lips. ° °The clinic staff is   available to answer your questions during regular business hours.  Please don’t hesitate to call and ask to speak to one of the nurses if you have concerns. ° °For further questions, please visit [email or web address] ° ° ° °

## 2014-03-13 LAB — CBC WITH DIFFERENTIAL/PLATELET
Basophils Absolute: 0 10*3/uL (ref 0.0–0.1)
Basophils Relative: 0 % (ref 0–1)
Eosinophils Absolute: 0.4 10*3/uL (ref 0.0–0.7)
Eosinophils Relative: 3 % (ref 0–5)
HCT: 23 % — ABNORMAL LOW (ref 39.0–52.0)
Hemoglobin: 7.8 g/dL — ABNORMAL LOW (ref 13.0–17.0)
Lymphocytes Relative: 15 % (ref 12–46)
Lymphs Abs: 1.9 10*3/uL (ref 0.7–4.0)
MCH: 28.4 pg (ref 26.0–34.0)
MCHC: 33.9 g/dL (ref 30.0–36.0)
MCV: 83.6 fL (ref 78.0–100.0)
Monocytes Absolute: 0.8 10*3/uL (ref 0.1–1.0)
Monocytes Relative: 6 % (ref 3–12)
Neutro Abs: 9.3 10*3/uL — ABNORMAL HIGH (ref 1.7–7.7)
Neutrophils Relative %: 76 % (ref 43–77)
Platelets: 361 10*3/uL (ref 150–400)
RBC: 2.75 MIL/uL — ABNORMAL LOW (ref 4.22–5.81)
RDW: 16.5 % — ABNORMAL HIGH (ref 11.5–15.5)
WBC: 12.5 10*3/uL — ABNORMAL HIGH (ref 4.0–10.5)

## 2014-03-13 LAB — GLUCOSE, CAPILLARY
Glucose-Capillary: 102 mg/dL — ABNORMAL HIGH (ref 70–99)
Glucose-Capillary: 183 mg/dL — ABNORMAL HIGH (ref 70–99)
Glucose-Capillary: 183 mg/dL — ABNORMAL HIGH (ref 70–99)
Glucose-Capillary: 184 mg/dL — ABNORMAL HIGH (ref 70–99)
Glucose-Capillary: 219 mg/dL — ABNORMAL HIGH (ref 70–99)
Glucose-Capillary: 219 mg/dL — ABNORMAL HIGH (ref 70–99)

## 2014-03-13 NOTE — Progress Notes (Signed)
Patient ID: Peter Summers, male   DOB: 12-19-1940, 73 y.o.   MRN: [id number] 9 Days Post-Op  Subjective: Developed purulent and bloody drainage from midline incision today. No abdominal pain. Tolerating diet.  Objective: Vital signs in last 24 hours: Temp:  [98 F (36.7 C)-98.7 F (37.1 C)] 98 F (36.7 C) (11/07 0545) Pulse Rate:  [90-103] 90 (11/07 0545) Resp:  [16-18] 18 (11/07 0545) BP: (105-136)/(60-75) 105/60 mmHg (11/07 0545) SpO2:  [97 %-100 %] 98 % (11/07 0545) Last BM Date: 03/10/14  Intake/Output from previous day: 11/06 0701 - 11/07 0700 In: 1680 [P.O.:1680] Out: 400 [Urine:400] Intake/Output this shift:    General appearance: alert, cooperative and no distress GI: normal findings: soft, non-tender Incision/Wound: purulent drainage from midline incision. Serous drainage from right lateral drain site.  Lab Results:   Recent Labs  03/11/14 0453  WBC 9.1  HGB 7.2*  HCT 21.6*  PLT 233   BMET  Recent Labs  03/11/14 0453  NA 138  K 3.7  CL 102  CO2 24  GLUCOSE 146*  BUN 9  CREATININE 0.87  CALCIUM 8.5     Studies/Results: No results found.  Anti-infectives: Anti-infectives    Start     Dose/Rate Route Frequency Ordered Stop   03/04/14 1800  cefOXitin (MEFOXIN) 1 g in dextrose 5 % 50 mL IVPB     1 g100 mL/hr over 30 Minutes Intravenous Every 6 hours 03/04/14 1614 03/05/14 0615   03/04/14 1200  ceFAZolin (ANCEF) IVPB 2 g/50 mL premix     2 g100 mL/hr over 30 Minutes Intravenous  Once 03/04/14 1157 03/04/14 1157   03/04/14 0519  ceFAZolin (ANCEF) IVPB 2 g/50 mL premix     2 g100 mL/hr over 30 Minutes Intravenous On call to O.R. 03/04/14 0519 03/04/14 0748      Assessment/Plan: s/p Procedure(s):  DIAGNOSTIC LAPAROSCOPY, PANCREATICODUODENECTOMY, REMOVAL OF BILIARY STENT, PLACEMENT OF PANCREATIC DUCT STENT HERNIA REPAIR UMBILICAL ADULT Overall doing well but has developed a wound infection. His midline wound was opened and purulent material  drained and cultured. Packed with moist saline gauze. We'll hold discharge today. Check CBC. There is no erythema or fever and less white count is elevated I would not think he would need antibiotics.   LOS: 9 days    Peter Summers T 03/13/2014

## 2014-03-14 LAB — GLUCOSE, CAPILLARY
Glucose-Capillary: 132 mg/dL — ABNORMAL HIGH (ref 70–99)
Glucose-Capillary: 146 mg/dL — ABNORMAL HIGH (ref 70–99)
Glucose-Capillary: 181 mg/dL — ABNORMAL HIGH (ref 70–99)
Glucose-Capillary: 254 mg/dL — ABNORMAL HIGH (ref 70–99)
Glucose-Capillary: 193 mg/dL — ABNORMAL HIGH (ref 70–99)
Glucose-Capillary: 243 mg/dL — ABNORMAL HIGH (ref 70–99)

## 2014-03-14 MED ORDER — INSULIN ASPART 100 UNIT/ML ~~LOC~~ SOLN
0.0000 [IU] | Freq: Three times a day (TID) | SUBCUTANEOUS | Status: DC
  Administered 2014-03-15: 2 [IU] via SUBCUTANEOUS

## 2014-03-14 MED ORDER — INFLUENZA VAC SPLIT QUAD 0.5 ML IM SUSY
0.5000 mL | PREFILLED_SYRINGE | INTRAMUSCULAR | Status: AC
  Administered 2014-03-15: 0.5 mL via INTRAMUSCULAR
  Filled 2014-03-14 (×2): qty 0.5

## 2014-03-14 NOTE — Progress Notes (Signed)
Patient ID: Peter Summers, male   DOB: [birth year], 73 y.o.   MRN: [id number] 10 Days Post-Op  Subjective: No major complaints today. A little concerned about his wound and does not want to go home.  Objective: Vital signs in last 24 hours: Temp:  [98 F (36.7 C)-98.2 F (36.8 C)] 98 F (36.7 C) (11/08 0627) Pulse Rate:  [78-80] 78 (11/08 0627) Resp:  [18-20] 18 (11/08 0627) BP: (106-116)/(64-68) 106/64 mmHg (11/08 0627) SpO2:  [98 %] 98 % (11/08 0627) Last BM Date: 03/13/14  Intake/Output from previous day: 11/07 0701 - 11/08 0700 In: 480 [P.O.:480] Out: 1350 [Urine:1350] Intake/Output this shift: Total I/O In: 240 [P.O.:240] Out: 150 [Urine:150]  General appearance: alert, cooperative and no distress GI: normal findings: soft, non-tender Incision/Wound: proximal much better. Repacked. Slight exudate at the base.  Lab Results:   Recent Labs  03/13/14 1023  WBC 12.5*  HGB 7.8*  HCT 23.0*  PLT 361   BMET No results for input(s): NA, K, CL, CO2, GLUCOSE, BUN, CREATININE, CALCIUM in the last 72 hours.   Studies/Results: No results found.  Anti-infectives: Anti-infectives    Start     Dose/Rate Route Frequency Ordered Stop   03/04/14 1800  cefOXitin (MEFOXIN) 1 g in dextrose 5 % 50 mL IVPB     1 g100 mL/hr over 30 Minutes Intravenous Every 6 hours 03/04/14 1614 03/05/14 0615   03/04/14 1200  ceFAZolin (ANCEF) IVPB 2 g/50 mL premix     2 g100 mL/hr over 30 Minutes Intravenous  Once 03/04/14 1157 03/04/14 1157   03/04/14 0519  ceFAZolin (ANCEF) IVPB 2 g/50 mL premix     2 g100 mL/hr over 30 Minutes Intravenous On call to O.R. 03/04/14 0519 03/04/14 0748      Assessment/Plan: s/p Procedure(s):  DIAGNOSTIC LAPAROSCOPY, PANCREATICODUODENECTOMY, REMOVAL OF BILIARY STENT, PLACEMENT OF PANCREATIC DUCT STENT HERNIA REPAIR UMBILICAL ADULT Superficial wound infection. Wound packed open and changing moist saline gauze packing. Mild leukocytosis. Continue wound care  today. Check CBC in a.m.   LOS: 10 days    Keirstin Musil T 03/14/2014

## 2014-03-14 NOTE — Plan of Care (Signed)
Problem: Discharge Progression Outcomes Goal: Activity appropriate for discharge plan Outcome: Completed/Met Date Met:  03/14/14     

## 2014-03-14 NOTE — Plan of Care (Signed)
Problem: Discharge Progression Outcomes Goal: Discharge plan in place and appropriate Outcome: Completed/Met Date Met:  03/14/14 Goal: Hemodynamically stable Outcome: Completed/Met Date Met:  03/14/14 Goal: Tubes and drains discontinued if indicated Outcome: Completed/Met Date Met:  03/14/14

## 2014-03-15 DIAGNOSIS — C250 Malignant neoplasm of head of pancreas: Secondary | ICD-10-CM | POA: Diagnosis not present

## 2014-03-15 LAB — GLUCOSE, CAPILLARY: Glucose-Capillary: 186 mg/dL — ABNORMAL HIGH (ref 70–99)

## 2014-03-15 LAB — CBC
HCT: 22.9 % — ABNORMAL LOW (ref 39.0–52.0)
Hemoglobin: 7.6 g/dL — ABNORMAL LOW (ref 13.0–17.0)
MCH: 28.1 pg (ref 26.0–34.0)
MCHC: 33.2 g/dL (ref 30.0–36.0)
MCV: 84.8 fL (ref 78.0–100.0)
Platelets: 344 10*3/uL (ref 150–400)
RBC: 2.7 MIL/uL — ABNORMAL LOW (ref 4.22–5.81)
RDW: 16.1 % — ABNORMAL HIGH (ref 11.5–15.5)
WBC: 10.8 10*3/uL — ABNORMAL HIGH (ref 4.0–10.5)

## 2014-03-15 NOTE — Plan of Care (Signed)
Problem: Discharge Progression Outcomes Goal: Staples/sutures removed Outcome: Completed/Met Date Met:  03/15/14

## 2014-03-15 NOTE — Discharge Summary (Signed)
Physician Discharge Summary  Patient ID: Peter Summers MRN: [id number] DOB/AGE: 1941/02/19 73 y.o.  Admit date: 03/04/2014 Discharge date: 03/15/2014  Admission Diagnoses:  Discharge Diagnoses:  Active Problems:   Adenocarcinoma of head of pancreas   Malnutrition of moderate degree chronic pancreatitis Hyperglycemia   Discharged Condition: stable  Hospital Course:  Pt was admitted post op to the ICU.  He did well.  He had some initial hypovolemia/hypotension, but this improved with some albumin.  He had his NGT removed on POD2.  He did not have any flatus yet, so he was kept NPO except for for ice chips.  He did relatively well with mobility.  He had some hyperglycemia, but was only getting 1-2 units of insulin per day.  His diet was slowly advanced. He was restarted on his home medications.  He was placed on metformin.  His drains were removed one at a time.  He originally was not eating much at all.  It took several days for that to pick up enough to make sure he was hydrated and having adequate calories.  He was essentially ready to go home, and then his midline wound started draining.  This was opened, and he received packing.  He had several days of antibiotics.  He was discharged to home in stable condition.    Consults: None  Significant Diagnostic Studies: labs: WBCs 12.5. FSG mostly 120-180.    Treatments: surgery: see above  Discharge Exam: Blood pressure 118/63, pulse 79, temperature 98.5 F (36.9 C), temperature source Oral, resp. rate 16, height 6\' 1"  (1.854 m), weight 186 lb 11.7 oz (84.7 kg), SpO2 100 %. General appearance: alert, cooperative and no distress Resp: breathing comfortably GI: soft, non distended, wound clean, no erythema Extremities: extremities normal, atraumatic, no cyanosis or edema  Disposition: 01-Home or Self Care  Discharge Instructions    Call MD for:  persistant nausea and vomiting    Complete by:  As directed      Call MD for:   redness, tenderness, or signs of infection (pain, swelling, redness, odor or green/yellow discharge around incision site)    Complete by:  As directed      Call MD for:  severe uncontrolled pain    Complete by:  As directed      Call MD for:  temperature >100.4    Complete by:  As directed      Change dressing (specify)    Complete by:  As directed   Dressing change: one times per day using wet to dry cause.     Diet - low sodium heart healthy    Complete by:  As directed      Increase activity slowly    Complete by:  As directed             Medication List    TAKE these medications        clonazePAM 1 MG tablet  Commonly known as:  KLONOPIN  Take 1 mg by mouth 2 (two) times daily.     esomeprazole 40 MG capsule  Commonly known as:  NEXIUM  Take 1 capsule (40 mg total) by mouth daily at 12 noon.     feeding supplement (GLUCERNA SHAKE) Liqd  Take 237 mLs by mouth 3 (three) times daily between meals.     ferrous fumarate 325 (106 FE) MG Tabs tablet  Commonly known as:  HEMOCYTE - 106 mg FE  Take 1 tablet by mouth daily.     finasteride  5 MG tablet  Commonly known as:  PROSCAR  Take 5 mg by mouth daily.     lisinopril-hydrochlorothiazide 20-12.5 MG per tablet  Commonly known as:  PRINZIDE,ZESTORETIC  Take 1 tablet by mouth every morning.     metFORMIN 500 MG 24 hr tablet  Commonly known as:  GLUCOPHAGE-XR  Take 500 mg by mouth daily with breakfast.     metFORMIN 850 MG tablet  Commonly known as:  GLUCOPHAGE  Take 1 tablet (850 mg total) by mouth 2 (two) times daily with a meal.     multivitamin with minerals Tabs tablet  Take 1 tablet by mouth daily.     oxyCODONE-acetaminophen 5-325 MG per tablet  Commonly known as:  PERCOCET/ROXICET  Take 1-2 tablets by mouth every 8 (eight) hours as needed for moderate pain or severe pain.           Follow-up Information    Follow up with [facility], MD In 2 weeks.   Specialty:  General Surgery   Contact  information:   [address] [place] [address] [phone]       Signed: [name] 03/15/2014, 9:01 AM

## 2014-03-16 LAB — WOUND CULTURE

## 2014-12-24 ENCOUNTER — Other Ambulatory Visit (HOSPITAL_COMMUNITY): Payer: Self-pay | Admitting: Psychiatry

## 2015-03-06 ENCOUNTER — Other Ambulatory Visit (HOSPITAL_COMMUNITY): Payer: Self-pay | Admitting: Psychiatry

## 2015-03-07 ENCOUNTER — Other Ambulatory Visit (HOSPITAL_COMMUNITY): Payer: Self-pay | Admitting: Psychiatry

## 2015-06-15 ENCOUNTER — Other Ambulatory Visit (HOSPITAL_COMMUNITY): Payer: Self-pay | Admitting: Psychiatry

## 2015-06-19 ENCOUNTER — Other Ambulatory Visit (HOSPITAL_COMMUNITY): Payer: Self-pay | Admitting: Psychiatry

## 2015-06-20 ENCOUNTER — Other Ambulatory Visit: Payer: Self-pay | Admitting: General Surgery

## 2015-06-20 ENCOUNTER — Ambulatory Visit: Payer: Self-pay | Admitting: Surgery

## 2015-06-20 DIAGNOSIS — D3A8 Other benign neuroendocrine tumors: Secondary | ICD-10-CM

## 2015-06-20 NOTE — H&P (Signed)
History of Present Illness Peter Summers. Deni Lefever MD; 06/20/2015 12:31 PM) Patient words: hernia.  The patient is a 75 year old male who presents with an incisional hernia. Patient of Dr. Barry Dienes. 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 He has had some pancreatic insufficiency. His weight is stable, but he has not really put much back on. He is doing well overall. At the time of surgery, he had a pre-existing umbilical hernia. This was closed as part of his fascial closure. However the hernia has recurred and has begun to protrude significantly. He wears an abdominal binder over this area. He is quite active and enjoys working out with [employer]. Dr. Barry Dienes has asked me to assume his care for hernia repair.     Allergies (Ammie Eversole, LPN; 075-GRM [id number] AM) No Known Drug Allergies 01/21/2014  Medication History (Ammie Eversole, LPN; 075-GRM [id number] AM) NexIUM (40MG  Capsule DR, 1 (one) Capsule DR Oral daily at noon, Taken starting 06/18/2014) Active. Creon (36000UNIT Capsule DR Part, 1 (one) Capsule DR Part Oral 3-5 times a day with meals and snacks, Taken starting 06/18/2014) Active. (Take 2 caps with meals and one cap with snacks) TraMADol HCl (50MG  Tablet, 1 (one) Tablet Oral every six hours, as needed, Taken starting 06/18/2014) Active. OxyCODONE HCl (5MG  Tablet, 1-2 Tablet Oral every eight hours, as needed for pain, Taken starting 05/11/2014) Active. Percocet (5-325MG  Tablet, 1 (one) Tablet Oral every four hours, as needed, Taken starting 04/19/2014) Active. Proscar (5MG  Tablet, Oral) Active. KlonoPIN (1MG  Tablet, Oral) Active. Ferrous Fumarate (325 (106 Fe)MG Tablet, Oral) Active. Lisinopril-Hydrochlorothiazide (20-12.5MG  Tablet, Oral) Active. MetFORMIN HCl (500MG  Tablet, Oral) Active. Multiple Vitamin (Oral) Active. Medications Reconciled    Vitals (Ammie Eversole LPN; 075-GRM [id number] AM) 06/20/2015 11:24 AM Weight: 182.8 lb  Height: 73in Body Surface Area: 2.07 m Body Mass Index: 24.12 kg/m  Temp.: 97.17F(Oral)  Pulse: 69 (Regular)  BP: 136/82 (Sitting, Left Arm, Standard)      Physical Exam Rodman Key K. Malak Duchesneau MD; 06/20/2015 12:32 PM)  The physical exam findings are as follows: Note:WDWN in NAD HEENT: EOMI, sclera anicteric Neck: No masses, no thyromegaly Lungs: CTA bilaterally; normal respiratory effort CV: Regular rate and rhythm; no murmurs Abd: +bowel sounds, soft, non-tender, healed midline incision - protruding hernia at lower end of incision near umbilicus Palpable fascial defect about 3-4 cm - easily reducible Ext: Well-perfused; no edema Skin: Warm, dry; no sign of jaundice    Assessment & Plan Rodman Key K. Javari Bufkin MD; 06/20/2015 11:39 AM)  VENTRAL INCISIONAL HERNIA (K43.2)  Current Plans Schedule for Surgery - Ventral hernia repair with mesh. The surgical procedure has been discussed with the patient. Potential risks, benefits, alternative treatments, and expected outcomes have been explained. All of the patient's questions at this time have been answered. The likelihood of reaching the patient's treatment goal is good. The patient understand the proposed surgical procedure and wishes to proceed.  Peter Summers. Georgette Dover, MD, [facility] Surgery  General/ Trauma Surgery  06/20/2015 12:34 PM

## 2015-06-27 ENCOUNTER — Ambulatory Visit
Admission: RE | Admit: 2015-06-27 | Discharge: 2015-06-27 | Disposition: A | Discharge status: Home / Self Care | Payer: Medicare Other | Source: Ambulatory Visit | Attending: General Surgery | Admitting: General Surgery

## 2015-06-27 DIAGNOSIS — D3A8 Other benign neuroendocrine tumors: Secondary | ICD-10-CM

## 2015-06-27 MED ORDER — IOPAMIDOL (ISOVUE-300) INJECTION 61%
100.0000 mL | Freq: Once | INTRAVENOUS | Status: AC | PRN
  Administered 2015-06-27: 100 mL via INTRAVENOUS

## 2015-08-24 ENCOUNTER — Other Ambulatory Visit (HOSPITAL_COMMUNITY): Payer: Medicare Other

## 2015-08-31 ENCOUNTER — Ambulatory Visit: Admit: 2015-08-31 | Payer: Self-pay | Admitting: Surgery

## 2015-08-31 SURGERY — REPAIR, HERNIA, VENTRAL
Anesthesia: General

## 2015-09-20 ENCOUNTER — Encounter (HOSPITAL_COMMUNITY): Payer: Self-pay

## 2015-09-20 ENCOUNTER — Other Ambulatory Visit (HOSPITAL_COMMUNITY): Payer: Self-pay | Admitting: *Deleted

## 2015-09-20 ENCOUNTER — Encounter (HOSPITAL_COMMUNITY)
Admission: RE | Admit: 2015-09-20 | Discharge: 2015-09-20 | Disposition: A | Discharge status: Home / Self Care | Payer: Medicare Other | Source: Ambulatory Visit | Attending: Surgery | Admitting: Surgery

## 2015-09-20 DIAGNOSIS — K432 Incisional hernia without obstruction or gangrene: Secondary | ICD-10-CM | POA: Diagnosis not present

## 2015-09-20 DIAGNOSIS — Z01812 Encounter for preprocedural laboratory examination: Secondary | ICD-10-CM | POA: Insufficient documentation

## 2015-09-20 DIAGNOSIS — Z01818 Encounter for other preprocedural examination: Secondary | ICD-10-CM | POA: Diagnosis not present

## 2015-09-20 DIAGNOSIS — I10 Essential (primary) hypertension: Secondary | ICD-10-CM | POA: Diagnosis not present

## 2015-09-20 HISTORY — DX: Ventral hernia without obstruction or gangrene: K43.9

## 2015-09-20 HISTORY — DX: Gastro-esophageal reflux disease without esophagitis: K21.9

## 2015-09-20 HISTORY — DX: Unspecified injury of unspecified wrist, hand and finger(s), initial encounter: S69.90XA

## 2015-09-20 HISTORY — DX: Unspecified osteoarthritis, unspecified site: M19.90

## 2015-09-20 LAB — BASIC METABOLIC PANEL
Anion gap: 7 (ref 5–15)
BUN: 21 mg/dL — ABNORMAL HIGH (ref 6–20)
CO2: 30 mmol/L (ref 22–32)
Calcium: 10 mg/dL (ref 8.9–10.3)
Chloride: 102 mmol/L (ref 101–111)
Creatinine, Ser: 1.06 mg/dL (ref 0.61–1.24)
GFR calc Af Amer: >60 mL/min (ref >60)
GFR calc non Af Amer: >60 mL/min (ref >60)
Glucose, Bld: 91 mg/dL (ref 65–99)
Potassium: 3.8 mmol/L (ref 3.5–5.1)
Sodium: 139 mmol/L (ref 135–145)

## 2015-09-20 LAB — CBC
HCT: 34.2 % — ABNORMAL LOW (ref 39.0–52.0)
Hemoglobin: 11.4 g/dL — ABNORMAL LOW (ref 13.0–17.0)
MCH: 28.2 pg (ref 26.0–34.0)
MCHC: 33.3 g/dL (ref 30.0–36.0)
MCV: 84.7 fL (ref 78.0–100.0)
Platelets: 213 10*3/uL (ref 150–400)
RBC: 4.04 MIL/uL — ABNORMAL LOW (ref 4.22–5.81)
RDW: 14.3 % (ref 11.5–15.5)
WBC: 6.3 10*3/uL (ref 4.0–10.5)

## 2015-09-20 LAB — GLUCOSE, CAPILLARY: Glucose-Capillary: 136 mg/dL — ABNORMAL HIGH (ref 65–99)

## 2015-09-20 NOTE — Progress Notes (Signed)
Call to [facility], Triage nurse  in [place], asked for preop orders. She will leave a msg. For Dr. Georgette Dover to put them in on 5/18, when he is scheduled to be in the office next. 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

## 2015-09-20 NOTE — Pre-Procedure Instructions (Signed)
Peter Summers  09/20/2015      [facility] - [place], [place] [address] [place] [address] Phone: [phone] Fax: [phone]    Your procedure is scheduled on : 09/28/2015  Report to [facility] Admitting at 6:30 A.M.  Call this number if you have problems the morning of surgery:  [phone]   Remember:   Do not eat food or drink liquids after midnight.  On Tuesday   Take these medicines the morning of surgery with A SIP OF WATER :  Clonazepam, Nexium, Detrol    Do not wear jewelry   Do not wear lotions, powders, or perfumes.  You may wear deodorant.    Men may shave face and neck.   Do not bring valuables to the hospital.   [facility] is not responsible for any belongings or valuables.  Contacts, dentures or bridgework may not be worn into surgery.  Leave your suitcase in the car.  After surgery it may be brought to your room.  For patients admitted to the hospital, discharge time will be determined by your treatment team.  Patients discharged the day of surgery will not be allowed to drive home.   Name and phone number of your driver:     Special instructions:     How to Manage Your Diabetes Before and After Surgery  Why is it important to control my blood sugar before and after surgery? . Improving blood sugar levels before and after surgery helps healing and can limit problems. . A way of improving blood sugar control is eating a healthy diet by: o  Eating less sugar and carbohydrates o  Increasing activity/exercise o  Talking with your doctor about reaching your blood sugar goals . High blood sugars (greater than 180 mg/dL) can raise your risk of infections and slow your recovery, so you will need to focus on controlling your diabetes during the weeks before surgery. . Make sure that the doctor who takes care of your diabetes knows about your  planned surgery including the date and location.  How do I manage my blood sugar before surgery? . Check your blood sugar at least 4 times a day, starting 2 days before surgery, to make sure that the level is not too high or low. o Check your blood sugar the morning of your surgery when you wake up and every 2 hours until you get to the Short Stay unit. . If your blood sugar is less than 70 mg/dL, you will need to treat for low blood sugar: o Do not take insulin. o Treat a low blood sugar (less than 70 mg/dL) with  cup of clear juice (cranberry or apple), 4 glucose tablets, OR glucose gel. o Recheck blood sugar in 15 minutes after treatment (to make sure it is greater than 70 mg/dL). If your blood sugar is not greater than 70 mg/dL on recheck, call [phone] for further instructions. . Report your blood sugar to the short stay nurse when you get to Short Stay.  . If you are admitted to the hospital after surgery: o Your blood sugar will be checked by the staff and you will probably be given insulin after surgery (instead of oral diabetes medicines) to make sure you have good blood sugar levels. o The goal for blood sugar control after surgery is 80-180 mg/dL.  WHAT DO I DO ABOUT MY DIABETES MEDICATION?   Marland Kitchen Do not take oral diabetes medicines (pills) the morning of surgery.  . The day of surgery, do not take other diabetes injectables, including Byetta (exenatide), Bydureon (exenatide ER), Victoza (liraglutide), or Trulicity (dulaglutide).  .   Other Instructions:          Patient Signature:  Date:   Nurse Signature:  Date:   Reviewed and Endorsed by [facility] Patient Education Committee, August 2015 Special Instructions: [facility] - Preparing for Surgery  Before surgery, you can play an important role.  Because skin is not sterile, your skin needs to be as free of germs as possible.  You can reduce the number of germs on you skin by washing with CHG  (chlorahexidine gluconate) soap before surgery.  CHG is an antiseptic cleaner which kills germs and bonds with the skin to continue killing germs even after washing.  Please DO NOT use if you have an allergy to CHG or antibacterial soaps.  If your skin becomes reddened/irritated stop using the CHG and inform your nurse when you arrive at Short Stay.  Do not shave (including legs and underarms) for at least 48 hours prior to the first CHG shower.  You may shave your face.  Please follow these instructions carefully:   1.  Shower with CHG Soap the night before surgery and the  morning of Surgery.  2.  If you choose to wash your hair, wash your hair first as usual with your  normal shampoo.  3.  After you shampoo, rinse your hair and body thoroughly to remove the  Shampoo.  4.  Use CHG as you would any other liquid soap.  You can apply chg directly to the skin and wash gently with scrungie or a clean washcloth.  5.  Apply the CHG Soap to your body ONLY FROM THE NECK DOWN.    Do not use on open wounds or open sores.  Avoid contact with your eyes, ears, mouth and genitals (private parts).  Wash genitals (private parts)   with your normal soap.  6.  Wash thoroughly, paying special attention to the area where your surgery will be performed.  7.  Thoroughly rinse your body with warm water from the neck down.  8.  DO NOT shower/wash with your normal soap after using and rinsing off   the CHG Soap.  9.  Pat yourself dry with a clean towel.            10.  Wear clean pajamas.            11.  Place clean sheets on your bed the night of your first shower and do not sleep with pets.  Day of Surgery  Do not apply any lotions/deodorants the morning of surgery.  Please wear clean clothes to the hospital/surgery center.  Please read over the following fact sheets that you were given. Pain Booklet, Coughing and Deep Breathing and Surgical Site Infection Prevention

## 2015-09-21 LAB — HEMOGLOBIN A1C
Hgb A1c MFr Bld: 7 % — ABNORMAL HIGH (ref 4.8–5.6)
Mean Plasma Glucose: 154 mg/dL

## 2015-09-22 ENCOUNTER — Ambulatory Visit: Payer: Self-pay | Admitting: Surgery

## 2015-09-27 ENCOUNTER — Encounter (HOSPITAL_COMMUNITY): Payer: Self-pay | Admitting: Anesthesiology

## 2015-09-27 MED ORDER — CEFAZOLIN SODIUM-DEXTROSE 2-4 GM/100ML-% IV SOLN
2.0000 g | INTRAVENOUS | Status: AC
  Administered 2015-09-28: 2 g via INTRAVENOUS
  Filled 2015-09-27 (×2): qty 100

## 2015-09-27 NOTE — Anesthesia Preprocedure Evaluation (Addendum)
Anesthesia Evaluation  Patient identified by MRN, date of birth, ID band Patient awake    Reviewed: Allergy & Precautions, NPO status , Patient's Chart, lab work & pertinent test results  Airway Mallampati: I  TM Distance: >3 FB Neck ROM: Full    Dental no notable dental hx. (+) Teeth Intact   Pulmonary neg pulmonary ROS,    Pulmonary exam normal breath sounds clear to auscultation       Cardiovascular hypertension, Pt. on medications  Rhythm:Regular Rate:Normal     Neuro/Psych Anxiety negative neurological ROS     GI/Hepatic GERD  Medicated and Controlled,Hx/o Ca head of pancreas S/P Whipple procedure   Endo/Other  diabetes, Well Controlled, Type 2, Oral Hypoglycemic Agents  Renal/GU   negative genitourinary   Musculoskeletal  (+) Arthritis , Osteoarthritis,  Bil. Shoulder   Abdominal   Peds  Hematology  (+) anemia ,   Anesthesia Other Findings   Reproductive/Obstetrics                           Anesthesia Physical Anesthesia Plan  ASA: II  Anesthesia Plan: General   Post-op Pain Management:    Induction: Intravenous, Rapid sequence and Cricoid pressure planned  Airway Management Planned: Oral ETT  Additional Equipment:   Intra-op Plan:   Post-operative Plan: Extubation in OR  Informed Consent: I have reviewed the patients History and Physical, chart, labs and discussed the procedure including the risks, benefits and alternatives for the proposed anesthesia with the patient or authorized representative who has indicated his/her understanding and acceptance.   Dental advisory given  Plan Discussed with: CRNA, Anesthesiologist and Surgeon  Anesthesia Plan Comments:         Anesthesia Quick Evaluation

## 2015-09-28 ENCOUNTER — Encounter (HOSPITAL_COMMUNITY)
Admission: RE | Disposition: A | Discharge status: Home / Self Care | Payer: Self-pay | Source: Ambulatory Visit | Attending: Surgery

## 2015-09-28 ENCOUNTER — Encounter (HOSPITAL_COMMUNITY): Payer: Self-pay | Admitting: Anesthesiology

## 2015-09-28 ENCOUNTER — Ambulatory Visit (HOSPITAL_COMMUNITY): Payer: Medicare Other | Admitting: Anesthesiology

## 2015-09-28 ENCOUNTER — Observation Stay (HOSPITAL_COMMUNITY)
Admission: RE | Admit: 2015-09-28 | Discharge: 2015-09-30 | Disposition: A | Discharge status: Home / Self Care | Payer: Medicare Other | Source: Ambulatory Visit | Attending: Surgery | Admitting: Surgery

## 2015-09-28 DIAGNOSIS — M19012 Primary osteoarthritis, left shoulder: Secondary | ICD-10-CM | POA: Insufficient documentation

## 2015-09-28 DIAGNOSIS — M19011 Primary osteoarthritis, right shoulder: Secondary | ICD-10-CM | POA: Diagnosis not present

## 2015-09-28 DIAGNOSIS — Z90411 Acquired partial absence of pancreas: Secondary | ICD-10-CM | POA: Diagnosis not present

## 2015-09-28 DIAGNOSIS — K439 Ventral hernia without obstruction or gangrene: Principal | ICD-10-CM | POA: Insufficient documentation

## 2015-09-28 DIAGNOSIS — K219 Gastro-esophageal reflux disease without esophagitis: Secondary | ICD-10-CM | POA: Diagnosis not present

## 2015-09-28 DIAGNOSIS — Z79899 Other long term (current) drug therapy: Secondary | ICD-10-CM | POA: Diagnosis not present

## 2015-09-28 DIAGNOSIS — Z79891 Long term (current) use of opiate analgesic: Secondary | ICD-10-CM | POA: Diagnosis not present

## 2015-09-28 DIAGNOSIS — I10 Essential (primary) hypertension: Secondary | ICD-10-CM | POA: Insufficient documentation

## 2015-09-28 DIAGNOSIS — K432 Incisional hernia without obstruction or gangrene: Secondary | ICD-10-CM | POA: Diagnosis present

## 2015-09-28 DIAGNOSIS — K469 Unspecified abdominal hernia without obstruction or gangrene: Secondary | ICD-10-CM | POA: Diagnosis present

## 2015-09-28 DIAGNOSIS — D649 Anemia, unspecified: Secondary | ICD-10-CM | POA: Diagnosis not present

## 2015-09-28 DIAGNOSIS — Z7984 Long term (current) use of oral hypoglycemic drugs: Secondary | ICD-10-CM | POA: Diagnosis not present

## 2015-09-28 HISTORY — PX: VENTRAL HERNIA REPAIR: SHX424

## 2015-09-28 HISTORY — PX: INSERTION OF MESH: SHX5868

## 2015-09-28 LAB — GLUCOSE, CAPILLARY
Glucose-Capillary: 90 mg/dL (ref 65–99)
Glucose-Capillary: 132 mg/dL — ABNORMAL HIGH (ref 65–99)
Glucose-Capillary: 139 mg/dL — ABNORMAL HIGH (ref 65–99)
Glucose-Capillary: 157 mg/dL — ABNORMAL HIGH (ref 65–99)
Glucose-Capillary: 111 mg/dL — ABNORMAL HIGH (ref 65–99)

## 2015-09-28 LAB — CBC
HCT: 35.3 % — ABNORMAL LOW (ref 39.0–52.0)
Hemoglobin: 11.3 g/dL — ABNORMAL LOW (ref 13.0–17.0)
MCH: 27.4 pg (ref 26.0–34.0)
MCHC: 32 g/dL (ref 30.0–36.0)
MCV: 85.7 fL (ref 78.0–100.0)
Platelets: 202 10*3/uL (ref 150–400)
RBC: 4.12 MIL/uL — ABNORMAL LOW (ref 4.22–5.81)
RDW: 14.5 % (ref 11.5–15.5)
WBC: 12.8 10*3/uL — ABNORMAL HIGH (ref 4.0–10.5)

## 2015-09-28 LAB — CREATININE, SERUM
Creatinine, Ser: 0.98 mg/dL (ref 0.61–1.24)
GFR calc Af Amer: >60 mL/min (ref >60)
GFR calc non Af Amer: >60 mL/min (ref >60)

## 2015-09-28 SURGERY — REPAIR, HERNIA, VENTRAL
Anesthesia: General | Site: Abdomen

## 2015-09-28 MED ORDER — ENOXAPARIN SODIUM 40 MG/0.4ML ~~LOC~~ SOLN
40.0000 mg | SUBCUTANEOUS | Status: DC
  Administered 2015-09-29 – 2015-09-30 (×2): 40 mg via SUBCUTANEOUS
  Filled 2015-09-28 (×2): qty 0.4

## 2015-09-28 MED ORDER — METHOCARBAMOL 500 MG PO TABS: 500.0000 mg | ORAL_TABLET | Freq: Four times a day (QID) | ORAL | Status: DC | PRN

## 2015-09-28 MED ORDER — PHENYLEPHRINE 40 MCG/ML (10ML) SYRINGE FOR IV PUSH (FOR BLOOD PRESSURE SUPPORT)
PREFILLED_SYRINGE | INTRAVENOUS | Status: AC
  Filled 2015-09-28: qty 10

## 2015-09-28 MED ORDER — LISINOPRIL 20 MG PO TABS
20.0000 mg | ORAL_TABLET | Freq: Every day | ORAL | Status: DC
  Administered 2015-09-28 – 2015-09-30 (×3): 20 mg via ORAL
  Filled 2015-09-28 (×3): qty 1

## 2015-09-28 MED ORDER — SUCCINYLCHOLINE CHLORIDE 20 MG/ML IJ SOLN
INTRAMUSCULAR | Status: DC | PRN
  Administered 2015-09-28: 120 mg via INTRAVENOUS

## 2015-09-28 MED ORDER — PANCRELIPASE (LIP-PROT-AMYL) 36000-114000 UNITS PO CPEP
36000.0000 [IU] | ORAL_CAPSULE | Freq: Three times a day (TID) | ORAL | Status: DC
  Administered 2015-09-28 – 2015-09-30 (×5): 36000 [IU] via ORAL
  Filled 2015-09-28 (×7): qty 1

## 2015-09-28 MED ORDER — OXYBUTYNIN CHLORIDE ER 5 MG PO TB24
5.0000 mg | ORAL_TABLET | Freq: Every day | ORAL | Status: DC
  Administered 2015-09-28 – 2015-09-29 (×2): 5 mg via ORAL
  Filled 2015-09-28 (×2): qty 1

## 2015-09-28 MED ORDER — FENTANYL CITRATE (PF) 100 MCG/2ML IJ SOLN
50.0000 ug | INTRAMUSCULAR | Status: DC | PRN
  Administered 2015-09-28 (×2): 50 ug via INTRAVENOUS
  Filled 2015-09-28 (×2): qty 2

## 2015-09-28 MED ORDER — HYDROMORPHONE HCL 1 MG/ML IJ SOLN
INTRAMUSCULAR | Status: AC
  Administered 2015-09-28: 0.5 mg via INTRAVENOUS
  Filled 2015-09-28: qty 1

## 2015-09-28 MED ORDER — ONDANSETRON 4 MG PO TBDP: 4.0000 mg | ORAL_TABLET | Freq: Four times a day (QID) | ORAL | Status: DC | PRN

## 2015-09-28 MED ORDER — ONDANSETRON HCL 4 MG/2ML IJ SOLN
4.0000 mg | Freq: Once | INTRAMUSCULAR | Status: AC | PRN
  Administered 2015-09-28: 4 mg via INTRAVENOUS

## 2015-09-28 MED ORDER — ROCURONIUM BROMIDE 50 MG/5ML IV SOLN
INTRAVENOUS | Status: AC
  Filled 2015-09-28: qty 1

## 2015-09-28 MED ORDER — ONDANSETRON HCL 4 MG/2ML IJ SOLN
INTRAMUSCULAR | Status: DC | PRN
  Administered 2015-09-28: 4 mg via INTRAVENOUS

## 2015-09-28 MED ORDER — ACETAMINOPHEN 500 MG PO TABS: 1000.0000 mg | ORAL_TABLET | Freq: Every day | ORAL | Status: DC | PRN

## 2015-09-28 MED ORDER — ROCURONIUM BROMIDE 100 MG/10ML IV SOLN
INTRAVENOUS | Status: DC | PRN
  Administered 2015-09-28: 10 mg via INTRAVENOUS
  Administered 2015-09-28: 20 mg via INTRAVENOUS
  Administered 2015-09-28: 10 mg via INTRAVENOUS

## 2015-09-28 MED ORDER — HYDROCHLOROTHIAZIDE 12.5 MG PO CAPS
12.5000 mg | ORAL_CAPSULE | Freq: Every day | ORAL | Status: DC
  Administered 2015-09-28 – 2015-09-30 (×3): 12.5 mg via ORAL
  Filled 2015-09-28 (×3): qty 1

## 2015-09-28 MED ORDER — INSULIN ASPART 100 UNIT/ML ~~LOC~~ SOLN
0.0000 [IU] | Freq: Three times a day (TID) | SUBCUTANEOUS | Status: DC
  Administered 2015-09-28: 1 [IU] via SUBCUTANEOUS
  Administered 2015-09-28: 2 [IU] via SUBCUTANEOUS
  Administered 2015-09-29: 1 [IU] via SUBCUTANEOUS
  Administered 2015-09-29: 2 [IU] via SUBCUTANEOUS

## 2015-09-28 MED ORDER — GLYCOPYRROLATE 0.2 MG/ML IJ SOLN
INTRAMUSCULAR | Status: DC | PRN
  Administered 2015-09-28: 0.4 mg via INTRAVENOUS

## 2015-09-28 MED ORDER — ONDANSETRON HCL 4 MG/2ML IJ SOLN
INTRAMUSCULAR | Status: AC
  Filled 2015-09-28: qty 2

## 2015-09-28 MED ORDER — FERROUS FUMARATE 324 (106 FE) MG PO TABS
1.0000 | ORAL_TABLET | Freq: Every day | ORAL | Status: DC
  Administered 2015-09-28 – 2015-09-30 (×3): 106 mg via ORAL
  Filled 2015-09-28 (×3): qty 1

## 2015-09-28 MED ORDER — SODIUM CHLORIDE 0.9 % IV SOLN
INTRAVENOUS | Status: DC
  Administered 2015-09-28 – 2015-09-29 (×2): via INTRAVENOUS

## 2015-09-28 MED ORDER — FENTANYL CITRATE (PF) 250 MCG/5ML IJ SOLN
INTRAMUSCULAR | Status: AC
  Filled 2015-09-28: qty 5

## 2015-09-28 MED ORDER — MIDAZOLAM HCL 2 MG/2ML IJ SOLN
INTRAMUSCULAR | Status: AC
  Filled 2015-09-28: qty 2

## 2015-09-28 MED ORDER — FINASTERIDE 5 MG PO TABS
5.0000 mg | ORAL_TABLET | Freq: Every day | ORAL | Status: DC
  Administered 2015-09-28 – 2015-09-30 (×3): 5 mg via ORAL
  Filled 2015-09-28 (×3): qty 1

## 2015-09-28 MED ORDER — LIDOCAINE 2% (20 MG/ML) 5 ML SYRINGE
INTRAMUSCULAR | Status: AC
  Filled 2015-09-28: qty 5

## 2015-09-28 MED ORDER — ONDANSETRON HCL 4 MG/2ML IJ SOLN
4.0000 mg | Freq: Four times a day (QID) | INTRAMUSCULAR | Status: DC | PRN
  Administered 2015-09-28: 4 mg via INTRAVENOUS
  Filled 2015-09-28: qty 2

## 2015-09-28 MED ORDER — SUCCINYLCHOLINE CHLORIDE 200 MG/10ML IV SOSY
PREFILLED_SYRINGE | INTRAVENOUS | Status: AC
  Filled 2015-09-28: qty 10

## 2015-09-28 MED ORDER — NEOSTIGMINE METHYLSULFATE 5 MG/5ML IV SOSY
PREFILLED_SYRINGE | INTRAVENOUS | Status: AC
  Filled 2015-09-28: qty 5

## 2015-09-28 MED ORDER — MEPERIDINE HCL 25 MG/ML IJ SOLN: 6.2500 mg | INTRAMUSCULAR | Status: DC | PRN

## 2015-09-28 MED ORDER — METFORMIN HCL ER 500 MG PO TB24
1000.0000 mg | ORAL_TABLET | Freq: Two times a day (BID) | ORAL | Status: DC
  Administered 2015-09-28 – 2015-09-30 (×4): 1000 mg via ORAL
  Filled 2015-09-28 (×5): qty 2

## 2015-09-28 MED ORDER — OXYCODONE-ACETAMINOPHEN 5-325 MG PO TABS
1.0000 | ORAL_TABLET | ORAL | Status: DC | PRN
  Administered 2015-09-28 – 2015-09-30 (×6): 2 via ORAL
  Filled 2015-09-28 (×7): qty 2

## 2015-09-28 MED ORDER — DIPHENHYDRAMINE HCL 50 MG/ML IJ SOLN: 12.5000 mg | Freq: Four times a day (QID) | INTRAMUSCULAR | Status: DC | PRN

## 2015-09-28 MED ORDER — ZOLPIDEM TARTRATE 5 MG PO TABS: 5.0000 mg | ORAL_TABLET | Freq: Every evening | ORAL | Status: DC | PRN

## 2015-09-28 MED ORDER — 0.9 % SODIUM CHLORIDE (POUR BTL) OPTIME
TOPICAL | Status: DC | PRN
  Administered 2015-09-28: 1000 mL

## 2015-09-28 MED ORDER — FENTANYL CITRATE (PF) 100 MCG/2ML IJ SOLN
INTRAMUSCULAR | Status: DC | PRN
  Administered 2015-09-28: 150 ug via INTRAVENOUS
  Administered 2015-09-28 (×2): 50 ug via INTRAVENOUS

## 2015-09-28 MED ORDER — PROPOFOL 10 MG/ML IV BOLUS
INTRAVENOUS | Status: DC | PRN
  Administered 2015-09-28: 160 mg via INTRAVENOUS

## 2015-09-28 MED ORDER — PANTOPRAZOLE SODIUM 40 MG PO TBEC
40.0000 mg | DELAYED_RELEASE_TABLET | Freq: Every day | ORAL | Status: DC
  Administered 2015-09-28 – 2015-09-30 (×3): 40 mg via ORAL
  Filled 2015-09-28 (×3): qty 1

## 2015-09-28 MED ORDER — GLIPIZIDE ER 10 MG PO TB24
10.0000 mg | ORAL_TABLET | Freq: Every day | ORAL | Status: DC
  Administered 2015-09-29 – 2015-09-30 (×2): 10 mg via ORAL
  Filled 2015-09-28 (×2): qty 1

## 2015-09-28 MED ORDER — BUPIVACAINE-EPINEPHRINE 0.25% -1:200000 IJ SOLN
INTRAMUSCULAR | Status: DC | PRN
  Administered 2015-09-28: 24 mL

## 2015-09-28 MED ORDER — NEOSTIGMINE METHYLSULFATE 10 MG/10ML IV SOLN
INTRAVENOUS | Status: DC | PRN
  Administered 2015-09-28: 3 mg via INTRAVENOUS

## 2015-09-28 MED ORDER — GLYCOPYRROLATE 0.2 MG/ML IV SOSY
PREFILLED_SYRINGE | INTRAVENOUS | Status: AC
  Filled 2015-09-28: qty 3

## 2015-09-28 MED ORDER — CEFAZOLIN SODIUM-DEXTROSE 2-4 GM/100ML-% IV SOLN
2.0000 g | Freq: Three times a day (TID) | INTRAVENOUS | Status: AC
  Administered 2015-09-28: 2 g via INTRAVENOUS
  Filled 2015-09-28: qty 100

## 2015-09-28 MED ORDER — LIDOCAINE HCL (CARDIAC) 20 MG/ML IV SOLN
INTRAVENOUS | Status: DC | PRN
  Administered 2015-09-28: 100 mg via INTRAVENOUS
  Administered 2015-09-28: 40 mg via INTRAVENOUS

## 2015-09-28 MED ORDER — HYDROMORPHONE HCL 1 MG/ML IJ SOLN
0.2500 mg | INTRAMUSCULAR | Status: DC | PRN
  Administered 2015-09-28 (×3): 0.5 mg via INTRAVENOUS

## 2015-09-28 MED ORDER — BUPIVACAINE-EPINEPHRINE (PF) 0.25% -1:200000 IJ SOLN
INTRAMUSCULAR | Status: AC
  Filled 2015-09-28: qty 30

## 2015-09-28 MED ORDER — DIPHENHYDRAMINE HCL 12.5 MG/5ML PO ELIX: 12.5000 mg | ORAL_SOLUTION | Freq: Four times a day (QID) | ORAL | Status: DC | PRN

## 2015-09-28 MED ORDER — LACTATED RINGERS IV SOLN
INTRAVENOUS | Status: DC | PRN
  Administered 2015-09-28 (×2): via INTRAVENOUS

## 2015-09-28 MED ORDER — PROPOFOL 10 MG/ML IV BOLUS
INTRAVENOUS | Status: AC
  Filled 2015-09-28: qty 20

## 2015-09-28 MED ORDER — EPHEDRINE 5 MG/ML INJ
INTRAVENOUS | Status: AC
  Filled 2015-09-28: qty 10

## 2015-09-28 MED ORDER — CHLORHEXIDINE GLUCONATE 4 % EX LIQD: 1.0000 "application " | Freq: Once | CUTANEOUS | Status: DC

## 2015-09-28 MED ORDER — LISINOPRIL-HYDROCHLOROTHIAZIDE 20-12.5 MG PO TABS: 1.0000 | ORAL_TABLET | Freq: Every morning | ORAL | Status: DC

## 2015-09-28 MED ORDER — CLONAZEPAM 1 MG PO TABS
1.0000 mg | ORAL_TABLET | Freq: Two times a day (BID) | ORAL | Status: DC
  Administered 2015-09-28 – 2015-09-30 (×5): 1 mg via ORAL
  Filled 2015-09-28 (×5): qty 1

## 2015-09-28 MED ORDER — PHENYLEPHRINE HCL 10 MG/ML IJ SOLN
10.0000 mg | INTRAVENOUS | Status: DC | PRN
  Administered 2015-09-28: 60 ug/min via INTRAVENOUS

## 2015-09-28 MED ORDER — GLUCERNA SHAKE PO LIQD
237.0000 mL | Freq: Two times a day (BID) | ORAL | Status: DC
  Administered 2015-09-29 – 2015-09-30 (×3): 237 mL via ORAL

## 2015-09-28 SURGICAL SUPPLY — 39 items
BENZOIN TINCTURE PRP APPL 2/3 (GAUZE/BANDAGES/DRESSINGS) ×3 IMPLANT
BLADE SURG ROTATE 9660 (MISCELLANEOUS) IMPLANT
CANISTER SUCTION 2500CC (MISCELLANEOUS) ×3 IMPLANT
CHLORAPREP W/TINT 26ML (MISCELLANEOUS) IMPLANT
CLOSURE STERI-STRIP 1/2X4 (GAUZE/BANDAGES/DRESSINGS) ×1
CLOSURE WOUND 1/2 X4 (GAUZE/BANDAGES/DRESSINGS) ×1
CLSR STERI-STRIP ANTIMIC 1/2X4 (GAUZE/BANDAGES/DRESSINGS) ×2 IMPLANT
COVER SURGICAL LIGHT HANDLE (MISCELLANEOUS) ×3 IMPLANT
DRAPE LAPAROSCOPIC ABDOMINAL (DRAPES) ×3 IMPLANT
DRSG TEGADERM 4X4.75 (GAUZE/BANDAGES/DRESSINGS) ×3 IMPLANT
ELECT CAUTERY BLADE 6.4 (BLADE) ×3 IMPLANT
ELECT REM PT RETURN 9FT ADLT (ELECTROSURGICAL) ×3
ELECTRODE REM PT RTRN 9FT ADLT (ELECTROSURGICAL) ×1 IMPLANT
GAUZE SPONGE 4X4 12PLY STRL (GAUZE/BANDAGES/DRESSINGS) ×3 IMPLANT
GLOVE BIO SURGEON STRL SZ7 (GLOVE) ×3 IMPLANT
GLOVE BIOGEL PI IND STRL 7.5 (GLOVE) ×1 IMPLANT
GLOVE BIOGEL PI INDICATOR 7.5 (GLOVE) ×2
GOWN STRL REUS W/ TWL LRG LVL3 (GOWN DISPOSABLE) ×2 IMPLANT
GOWN STRL REUS W/TWL LRG LVL3 (GOWN DISPOSABLE) ×4
KIT BASIN OR (CUSTOM PROCEDURE TRAY) ×3 IMPLANT
KIT ROOM TURNOVER OR (KITS) ×3 IMPLANT
MESH VENT ST 8.12CM S OVL (Mesh General) ×3 IMPLANT
NEEDLE HYPO 25GX1X1/2 BEV (NEEDLE) ×3 IMPLANT
NS IRRIG 1000ML POUR BTL (IV SOLUTION) ×3 IMPLANT
PACK GENERAL/GYN (CUSTOM PROCEDURE TRAY) ×3 IMPLANT
PAD ARMBOARD 7.5X6 YLW CONV (MISCELLANEOUS) ×6 IMPLANT
SPONGE GAUZE 4X4 12PLY STER LF (GAUZE/BANDAGES/DRESSINGS) ×3 IMPLANT
STRIP CLOSURE SKIN 1/2X4 (GAUZE/BANDAGES/DRESSINGS) ×2 IMPLANT
SUT MNCRL AB 4-0 PS2 18 (SUTURE) ×6 IMPLANT
SUT NOVA NAB GS-21 0 18 T12 DT (SUTURE) ×12 IMPLANT
SUT NOVA NAB GS-21 1 T12 (SUTURE) IMPLANT
SUT VIC AB 3-0 CT1 27 (SUTURE) ×2
SUT VIC AB 3-0 CT1 TAPERPNT 27 (SUTURE) ×1 IMPLANT
SUT VIC AB 3-0 SH 27 (SUTURE) ×4
SUT VIC AB 3-0 SH 27XBRD (SUTURE) ×2 IMPLANT
SYR CONTROL 10ML LL (SYRINGE) ×3 IMPLANT
TOWEL OR 17X24 6PK STRL BLUE (TOWEL DISPOSABLE) ×3 IMPLANT
TOWEL OR 17X26 10 PK STRL BLUE (TOWEL DISPOSABLE) ×3 IMPLANT
TRAY FOLEY CATH 14FRSI W/METER (CATHETERS) IMPLANT

## 2015-09-28 NOTE — Transfer of Care (Signed)
Immediate Anesthesia Transfer of Care Note  Patient: Peter Summers  Procedure(s) Performed: Procedure(s):  [place] (N/A) INSERTION OF MESH (N/A)  Patient Location: PACU  Anesthesia Type:General  Level of Consciousness: awake, alert  and patient cooperative  Airway & Oxygen Therapy: Patient Spontanous Breathing and Patient connected to nasal cannula oxygen  Post-op Assessment: Report given to RN, Post -op Vital signs reviewed and stable and Patient moving all extremities  Post vital signs: Reviewed and stable  Last Vitals:  Filed Vitals:   09/28/15 0659  BP: 139/72  Pulse: 81  Temp: 37.1 C  Resp: 20    Last Pain: There were no vitals filed for this visit.    Patients Stated Pain Goal: 2 ([id number])  Complications: No apparent anesthesia complications

## 2015-09-28 NOTE — Progress Notes (Signed)
Abdominal binder placed on pt. Susie Cassette RN

## 2015-09-28 NOTE — Op Note (Signed)
Ventral Hernia Repair with mesh Procedure Note  Indications: Symptomatic ventral incisional hernia after previous Whipple surgery  Pre-operative Diagnosis: Ventral hernia  Post-operative Diagnosis: Ventral hernia  Surgeon: Devi Hopman K.   Assistants: none  Anesthesia: General endotracheal anesthesia  ASA Class: 2  Procedure Details  The patient was seen in the Holding Room. The risks, benefits, complications, treatment options, and expected outcomes were discussed with the patient. The possibilities of reaction to medication, pulmonary aspiration, perforation of viscus, bleeding, recurrent infection, the need for additional procedures, failure to diagnose a condition, and creating a complication requiring transfusion or operation were discussed with the patient. The patient concurred with the proposed plan, giving informed consent.  The site of surgery properly noted/marked. The patient was taken to the operating room, identified as Peter Summers and the procedure verified as ventral hernia repair. A Time Out was held and the above information confirmed.  The patient was placed supine.  After establishing general anesthesia, the abdomen was prepped with Chloraprep and draped in sterile fashion.  We made a vertical incision over the palpable hernia at the umbilicus. Dissection was carried down to the hernia sac located above the fascia and mobilized from surrounding structures.  The hernia sac was opened and there was some adherent colon to the hernia sac.  The adhesions were taken down with Metzenbaum scissors.  Intact fascia was identified circumferentially around the defect.  We raised flaps anterior to the fascial edges using cautery.  The defect measured 3 x 4 cm and was oriented in a transverse fashion.  We used a 8 x 12 cm Ventrio mesh and secured this to the fascia with eight interrupted 0 Novofil sutures.  The fascial defect was reapproximated with interrupted figure-of-8 1 Novofil  sutures.  The subcutaneous tissues were irrigated.  Some of the excess skin was excised.  The dermis and the subcutaneous dead space were closed with multiple interrupted 3-0 Vicryl.  The skin incision was closed with a 4-0 subcuticular closure. Steri-Strips were applied at the end of the operation.    Instrument, sponge, and needle counts were correct prior to closure and at the conclusion of the case.   Findings: 3 x 4 cm fascial defect  Estimated Blood Loss:  less than 50 mL         Drains: none                      Complications:  None; patient tolerated the procedure well.         Disposition: PACU - hemodynamically stable.         Condition: stable  Imogene Burn. Georgette Dover, MD, [facility] Surgery  General/ Trauma Surgery  09/28/2015 10:09 AM

## 2015-09-28 NOTE — Anesthesia Procedure Notes (Signed)
Procedure Name: Intubation Date/Time: 09/28/2015 8:29 AM Performed by: Izora Gala Pre-anesthesia Checklist: Patient identified, Emergency Drugs available, Suction available and Patient being monitored Patient Re-evaluated:Patient Re-evaluated prior to inductionOxygen Delivery Method: Circle system utilized Preoxygenation: Pre-oxygenation with 100% oxygen Intubation Type: IV induction Ventilation: Mask ventilation without difficulty Laryngoscope Size: Miller and 3 Grade View: Grade II Tube type: Oral Tube size: 7.5 mm Number of attempts: 1 Airway Equipment and Method: Stylet Placement Confirmation: ETT inserted through vocal cords under direct vision,  positive ETCO2 and breath sounds checked- equal and bilateral Secured at: 22 cm Tube secured with: Tape Dental Injury: Teeth and Oropharynx as per pre-operative assessment

## 2015-09-28 NOTE — Anesthesia Postprocedure Evaluation (Signed)
Anesthesia Post Note  Patient: Peter Summers  Procedure(s) Performed: Procedure(s) (LRB):  VENTRAL INCISIONAL HERNIA REPAIR (N/A) INSERTION OF MESH (N/A)  Patient location during evaluation: PACU Anesthesia Type: General Level of consciousness: awake and alert and oriented Pain management: pain level controlled Vital Signs Assessment: post-procedure vital signs reviewed and stable Respiratory status: spontaneous breathing, nonlabored ventilation and respiratory function stable Cardiovascular status: blood pressure returned to baseline and stable Postop Assessment: no signs of nausea or vomiting Anesthetic complications: no    Last Vitals:  Filed Vitals:   09/28/15 1045 09/28/15 1055  BP:  149/82  Pulse: 74 91  Temp:    Resp: 25 24    Last Pain:  Filed Vitals:   09/28/15 1058  PainSc: Asleep                 Nadiah Corbit A.

## 2015-09-28 NOTE — H&P (Signed)
  History of Present Illness  Patient words: hernia.  The patient is a 75 year old male who presents with an incisional hernia. Patient of Dr. Barry Dienes. 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 He has had some pancreatic insufficiency. His weight is stable, but he has not really put much back on. He is doing well overall. At the time of surgery, he had a pre-existing umbilical hernia. This was closed as part of his fascial closure. However the hernia has recurred and has begun to protrude significantly. He wears an abdominal binder over this area. He is quite active and enjoys working out with [employer]. Dr. Barry Dienes has asked me to assume his care for hernia repair.     Allergies No Known Drug Allergies 01/21/2014  Medication HistoryNexIUM (40MG  Capsule DR, 1 (one) Capsule DR Oral daily at noon, Taken starting 06/18/2014) Active. Creon (36000UNIT Capsule DR Part, 1 (one) Capsule DR Part Oral 3-5 times a day with meals and snacks, Taken starting 06/18/2014) Active. (Take 2 caps with meals and one cap with snacks) TraMADol HCl (50MG  Tablet, 1 (one) Tablet Oral every six hours, as needed, Taken starting 06/18/2014) Active. OxyCODONE HCl (5MG  Tablet, 1-2 Tablet Oral every eight hours, as needed for pain, Taken starting 05/11/2014) Active. Percocet (5-325MG  Tablet, 1 (one) Tablet Oral every four hours, as needed, Taken starting 04/19/2014) Active. Proscar (5MG  Tablet, Oral) Active. KlonoPIN (1MG  Tablet, Oral) Active. Ferrous Fumarate (325 (106 Fe)MG Tablet, Oral) Active. Lisinopril-Hydrochlorothiazide (20-12.5MG  Tablet, Oral) Active. MetFORMIN HCl (500MG  Tablet, Oral) Active. Multiple Vitamin (Oral) Active. Medications Reconciled    Vitals  Weight: 182.8 lb Height: 73in Body Surface Area: 2.07 m Body Mass Index: 24.12 kg/m  Temp.: 97.33F(Oral)  Pulse: 69 (Regular)  BP: 136/82 (Sitting, Left Arm, Standard)      Physical  Exam   The physical exam findings are as follows: Note:WDWN in NAD HEENT: EOMI, sclera anicteric Neck: No masses, no thyromegaly Lungs: CTA bilaterally; normal respiratory effort CV: Regular rate and rhythm; no murmurs Abd: +bowel sounds, soft, non-tender, healed midline incision - protruding hernia at lower end of incision near umbilicus Palpable fascial defect about 3-4 cm - easily reducible Ext: Well-perfused; no edema Skin: Warm, dry; no sign of jaundice    Assessment & Plan  VENTRAL INCISIONAL HERNIA (K43.2)  Current Plans Schedule for Surgery - Ventral hernia repair with mesh. The surgical procedure has been discussed with the patient. Potential risks, benefits, alternative treatments, and expected outcomes have been explained. All of the patient's questions at this time have been answered. The likelihood of reaching the patient's treatment goal is good. The patient understand the proposed surgical procedure and wishes to proceed.  Imogene Burn. Georgette Dover, MD, [facility]  General/ Trauma Surgery  09/28/2015 6:37 AM

## 2015-09-29 ENCOUNTER — Encounter (HOSPITAL_COMMUNITY): Payer: Self-pay | Admitting: Surgery

## 2015-09-29 DIAGNOSIS — K439 Ventral hernia without obstruction or gangrene: Secondary | ICD-10-CM | POA: Diagnosis not present

## 2015-09-29 LAB — GLUCOSE, CAPILLARY
Glucose-Capillary: 117 mg/dL — ABNORMAL HIGH (ref 65–99)
Glucose-Capillary: 167 mg/dL — ABNORMAL HIGH (ref 65–99)
Glucose-Capillary: 134 mg/dL — ABNORMAL HIGH (ref 65–99)
Glucose-Capillary: 125 mg/dL — ABNORMAL HIGH (ref 65–99)

## 2015-09-29 LAB — BASIC METABOLIC PANEL
Anion gap: 9 (ref 5–15)
BUN: 13 mg/dL (ref 6–20)
CO2: 26 mmol/L (ref 22–32)
Calcium: 9.5 mg/dL (ref 8.9–10.3)
Chloride: 103 mmol/L (ref 101–111)
Creatinine, Ser: 1.08 mg/dL (ref 0.61–1.24)
GFR calc Af Amer: >60 mL/min (ref >60)
GFR calc non Af Amer: >60 mL/min (ref >60)
Glucose, Bld: 120 mg/dL — ABNORMAL HIGH (ref 65–99)
Potassium: 4.2 mmol/L (ref 3.5–5.1)
Sodium: 138 mmol/L (ref 135–145)

## 2015-09-29 LAB — CBC
HCT: 38.6 % — ABNORMAL LOW (ref 39.0–52.0)
Hemoglobin: 12.6 g/dL — ABNORMAL LOW (ref 13.0–17.0)
MCH: 27.2 pg (ref 26.0–34.0)
MCHC: 32.6 g/dL (ref 30.0–36.0)
MCV: 83.4 fL (ref 78.0–100.0)
Platelets: 227 10*3/uL (ref 150–400)
RBC: 4.63 MIL/uL (ref 4.22–5.81)
RDW: 14.3 % (ref 11.5–15.5)
WBC: 14.3 10*3/uL — ABNORMAL HIGH (ref 4.0–10.5)

## 2015-09-29 MED ORDER — OXYCODONE-ACETAMINOPHEN 5-325 MG PO TABS: 1.0000 | ORAL_TABLET | ORAL | Status: AC | PRN

## 2015-09-29 NOTE — Care Management Obs Status (Signed)
[facility] NOTIFICATION   Patient Details  Name: Peter Summers MRN: [id number] Date of Birth: 18-Dec-1940   Medicare Observation Status Notification Given:  Yes (observation procedure )    Marilu Favre, RN 09/29/2015, 2:27 PM

## 2015-09-29 NOTE — Progress Notes (Signed)
1 Day Post-Op  Subjective: Patient still with significant discomfort when he tries to move. Voiding well Poor appetite, but no nausea No BM yet  Objective: Vital signs in last 24 hours: Temp:  [97.9 F (36.6 C)-99.5 F (37.5 C)] 99.2 F (37.3 C) (05/25 0528) Pulse Rate:  [88-106] 91 (05/25 0528) Resp:  [17-18] 17 (05/25 0528) BP: (141-152)/(71-81) 145/80 mmHg (05/25 0528) SpO2:  [94 %-97 %] 95 % (05/25 0528)    Intake/Output from previous day: 05/24 0701 - 05/25 0700 In: 1220 [P.O.:220; I.V.:1000] Out: 1690 [Urine:1680; Blood:10] Intake/Output this shift: Total I/O In: 460 [P.O.:460] Out: 1100 [Urine:1100]  General appearance: alert, cooperative and no distress GI: soft, incisional tenderness Dressing, - minimal staining; dry  Lab Results:   Recent Labs  09/28/15 1151 09/29/15 0822  WBC 12.8* 14.3*  HGB 11.3* 12.6*  HCT 35.3* 38.6*  PLT 202 227   BMET  Recent Labs  09/28/15 1151 09/29/15 0822  NA  --  138  K  --  4.2  CL  --  103  CO2  --  26  GLUCOSE  --  120*  BUN  --  13  CREATININE 0.98 1.08  CALCIUM  --  9.5   PT/INR No results for input(s): LABPROT, INR in the last 72 hours. ABG No results for input(s): PHART, HCO3 in the last 72 hours.  Invalid input(s): PCO2, PO2  Studies/Results: No results found.  Anti-infectives: Anti-infectives    Start     Dose/Rate Route Frequency Ordered Stop   09/28/15 1600  ceFAZolin (ANCEF) IVPB 2g/100 mL premix     2 g 200 mL/hr over 30 Minutes Intravenous Every 8 hours 09/28/15 1144 09/28/15 1606   09/28/15 0800  ceFAZolin (ANCEF) IVPB 2g/100 mL premix     2 g 200 mL/hr over 30 Minutes Intravenous To [facility] 09/27/15 0744 09/28/15 0850      Assessment/Plan: s/p Procedure(s):  VENTRAL INCISIONAL HERNIA REPAIR (N/A) INSERTION OF MESH (N/A) Plan for discharge tomorrow Will keep patient overnight to encourage ambulation/ pain control with PO pain meds     Kenzli Barritt  K. 09/29/2015

## 2015-09-30 DIAGNOSIS — K439 Ventral hernia without obstruction or gangrene: Secondary | ICD-10-CM | POA: Diagnosis not present

## 2015-09-30 LAB — GLUCOSE, CAPILLARY: Glucose-Capillary: 102 mg/dL — ABNORMAL HIGH (ref 65–99)

## 2015-09-30 NOTE — Discharge Summary (Signed)
Physician Discharge Summary  Patient ID: Peter Summers MRN: [id number] DOB/AGE: [birth year] 75 y.o.  Admit date: 09/28/2015 Discharge date: 09/30/2015  Admission Diagnoses:  Ventral incisional hernia  Discharge Diagnoses: Same Active Problems:   Ventral incisional hernia   Discharged Condition: good  Hospital Course: Open repair of ventral incisional hernia on 09/28/15.  Mesh repair.  Kept two nights for pain control.  Voiding well.  No nausea.  Pain now controlled - ambulating without difficulty  Consults: None  Significant Diagnostic Studies: none  Treatments: surgery: open ventral hernia repair with mesh  Discharge Exam: Blood pressure 142/84, pulse 87, temperature 98.5 F (36.9 C), temperature source Oral, resp. rate 17, height 6\' 1"  (1.854 m), weight 81.789 kg (180 lb 5 oz), SpO2 97 %. General appearance: alert, cooperative and no distress GI: soft, incisional tenderness; non-distended Dressing removed - steri-strips dry; incision c/d/i; minimal swelling  Disposition: 01-Home or Self Care  Discharge Instructions    Call MD for:  persistant nausea and vomiting    Complete by:  As directed      Call MD for:  redness, tenderness, or signs of infection (pain, swelling, redness, odor or green/yellow discharge around incision site)    Complete by:  As directed      Call MD for:  severe uncontrolled pain    Complete by:  As directed      Call MD for:  temperature >100.4    Complete by:  As directed      Diet general    Complete by:  As directed      Driving Restrictions    Complete by:  As directed   Do not drive while taking pain medications     Increase activity slowly    Complete by:  As directed      May shower / Bathe    Complete by:  As directed      May walk up steps    Complete by:  As directed             Medication List    TAKE these medications        acetaminophen 500 MG tablet  Commonly known as:  TYLENOL  Take 1,000 mg by mouth daily as  needed for mild pain.     clonazePAM 1 MG tablet  Commonly known as:  KLONOPIN  Take 1 mg by mouth 2 (two) times daily.     CREON 36000 UNITS Cpep capsule  Generic drug:  lipase/protease/amylase  Take 72,000 mg by mouth 3 (three) times daily with meals.     esomeprazole 40 MG capsule  Commonly known as:  NEXIUM  Take 1 capsule (40 mg total) by mouth daily at 12 noon.     feeding supplement (GLUCERNA SHAKE) Liqd  Take 237 mLs by mouth 3 (three) times daily between meals.     ferrous fumarate 325 (106 Fe) MG Tabs tablet  Commonly known as:  HEMOCYTE - 106 mg FE  Take 1 tablet by mouth daily.     finasteride 5 MG tablet  Commonly known as:  PROSCAR  Take 5 mg by mouth daily.     glipiZIDE 10 MG 24 hr tablet  Commonly known as:  GLUCOTROL XL  Take 10 mg by mouth daily with breakfast.     lisinopril-hydrochlorothiazide 20-12.5 MG tablet  Commonly known as:  PRINZIDE,ZESTORETIC  Take 1 tablet by mouth every morning.     metFORMIN 500 MG 24 hr tablet  Commonly known as:  GLUCOPHAGE-XR  Take 1,000 mg by mouth 2 (two) times daily.     multivitamin with minerals Tabs tablet  Take 1 tablet by mouth daily.     oxyCODONE-acetaminophen 5-325 MG tablet  Commonly known as:  PERCOCET/ROXICET  Take 1-2 tablets by mouth every 4 (four) hours as needed for moderate pain.     oxyCODONE-acetaminophen 5-325 MG tablet  Commonly known as:  PERCOCET/ROXICET  Take 1 tablet by mouth every 4 (four) hours as needed for severe pain.     tolterodine 2 MG tablet  Commonly known as:  DETROL  Take 2 mg by mouth 2 (two) times daily.           Follow-up Information    Follow up with Zayquan Bogard K., MD. Schedule an appointment as soon as possible for a visit in 3 weeks.   Specialty:  General Surgery   Contact information:   [address] [place] [address] [phone]       Signed: Maia Petties. 09/30/2015, 8:29 AM

## 2015-09-30 NOTE — Discharge Instructions (Signed)
[facility], [place]  HERNIA REPAIR: POST OP INSTRUCTIONS  Always review your discharge instruction sheet given to you by the facility where your surgery was performed. IF YOU HAVE DISABILITY OR FAMILY LEAVE FORMS, YOU MUST BRING THEM TO THE OFFICE FOR PROCESSING.   DO NOT GIVE THEM TO YOUR DOCTOR.  1. A  prescription for pain medication may be given to you upon discharge.  Take your pain medication as prescribed, if needed.  If narcotic pain medicine is not needed, then you may take acetaminophen (Tylenol) or ibuprofen (Advil) as needed. 2. Take your usually prescribed medications unless otherwise directed. 3. If you need a refill on your pain medication, please contact your pharmacy.  They will contact our office to request authorization. Prescriptions will not be filled after 5 pm or on week-ends. 4. You should follow a light diet the first 24 hours after arrival home, such as soup and crackers, etc.  Be sure to include lots of fluids daily.  Resume your normal diet the day after surgery. 5. Most patients will experience some swelling and bruising around the incision.  Ice packs and reclining will help.  Swelling and bruising can take several days to resolve.  6. It is common to experience some constipation if taking pain medication after surgery.  Increasing fluid intake and taking a stool softener (such as Colace) will usually help or prevent this problem from occurring.  A mild laxative (Milk of Magnesia or Miralax) should be taken according to package directions if there are no bowel movements after 48 hours. 7. Unless discharge instructions indicate otherwise, you may remove your bandages 24-48 hours after surgery, and you may shower at that time.  You will have steri-strips (small skin tapes) in place directly over the incision.  These strips should be left on the skin for 7-10 days. 8. ACTIVITIES:  You may resume regular (light) daily activities beginning the next day--such as daily  self-care, walking, climbing stairs--gradually increasing activities as tolerated.  You may have sexual intercourse when it is comfortable.  Refrain from any heavy lifting or straining until approved by your doctor. a. You may drive when you are no longer taking prescription pain medication, you can comfortably wear a seatbelt, and you can safely maneuver your car and apply brakes. b. RETURN TO WORK:  2-3 weeks with light duty - no lifting over 15 lbs. 9. You should see your doctor in the office for a follow-up appointment approximately 2-3 weeks after your surgery.  Make sure that you call for this appointment within a day or two after you arrive home to insure a convenient appointment time. 10. OTHER INSTRUCTIONS:  __________________________________________________________________________________________________________________________________________________________________________________________  WHEN TO CALL YOUR DOCTOR: 1. Fever over 101.0 2. Inability to urinate 3. Nausea and/or vomiting 4. Extreme swelling or bruising 5. Continued bleeding from incision. 6. Increased pain, redness, or drainage from the incision  The clinic staff is available to answer your questions during regular business hours.  Please dont hesitate to call and ask to speak to one of the nurses for clinical concerns.  If you have a medical emergency, go to the nearest emergency room or call 911.  A surgeon from [facility] is always on call at the hospital   [address] [place], [place], [place]  [address] ?  [address], [place], [place]   [address] [phone]    FAX [phone] Web site: [email or web address]

## 2015-12-13 IMAGING — US US ABDOMEN COMPLETE
1 series · 13 of 25 positions shown · non-contrast
Comparison: None.

CLINICAL DATA: Abdominal pain for 2 weeks after a colonoscopy.

EXAM:
ULTRASOUND ABDOMEN COMPLETE

[Series 1: us abdomen complete · 0.24mm/px · 13 of 96 slices shown]
[im 1/96]
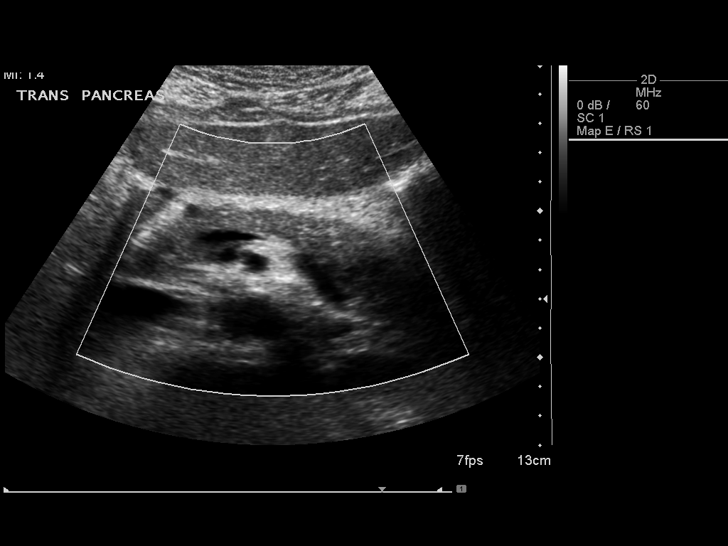
[im 8/96]
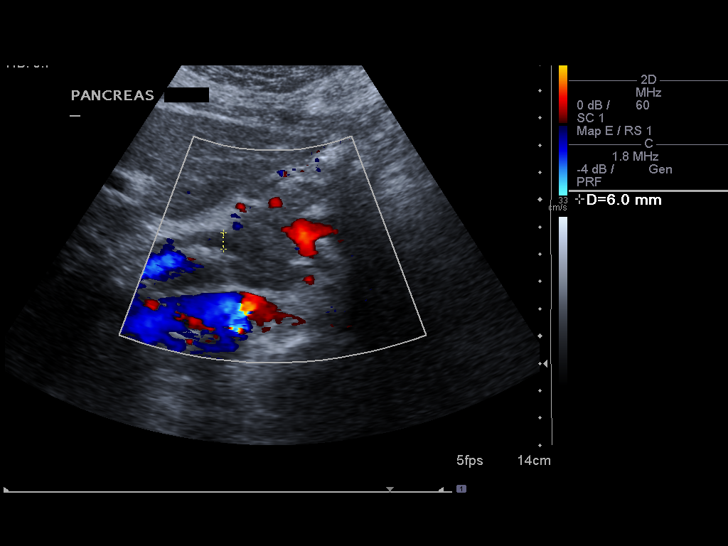
[im 16/96]
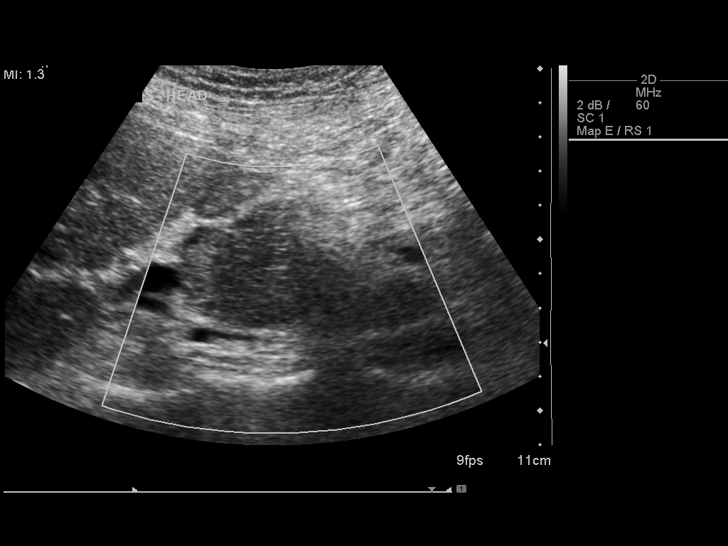
[im 24/96]
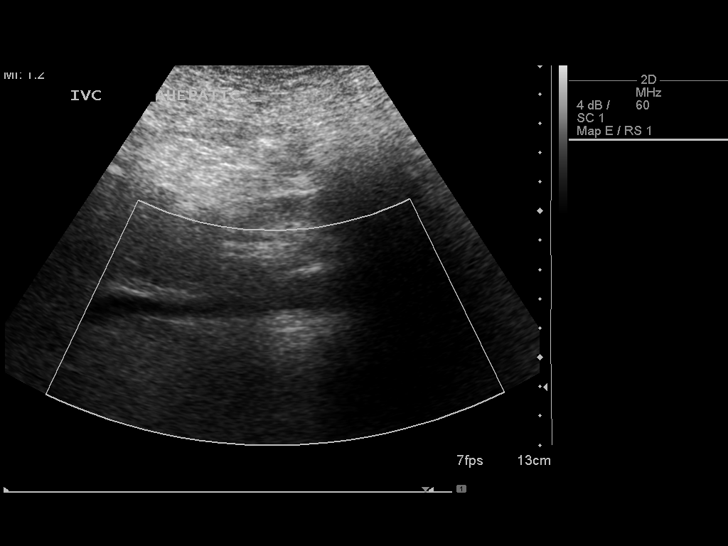
[im 32/96]
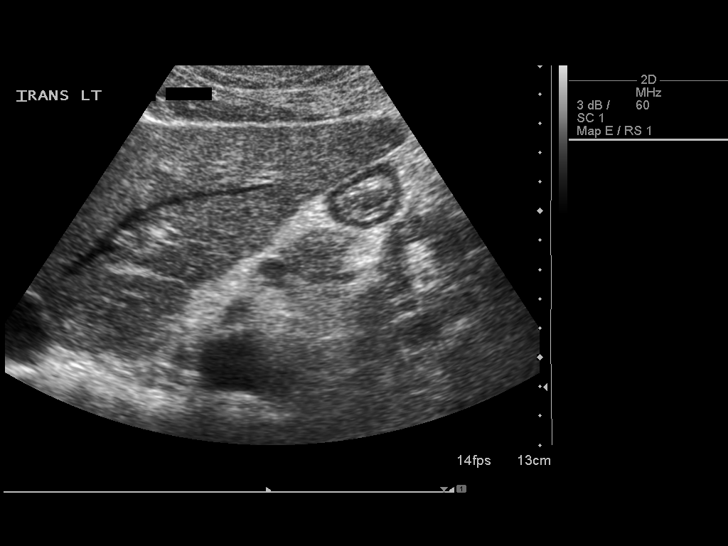
[im 40/96]
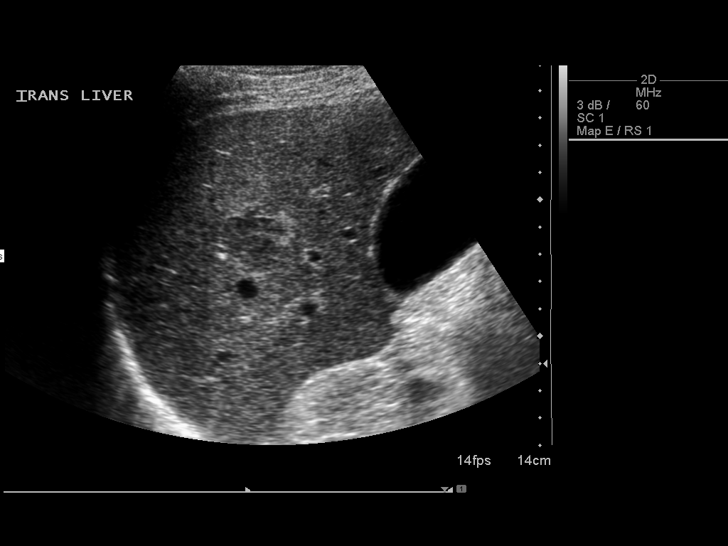
[im 48/96]
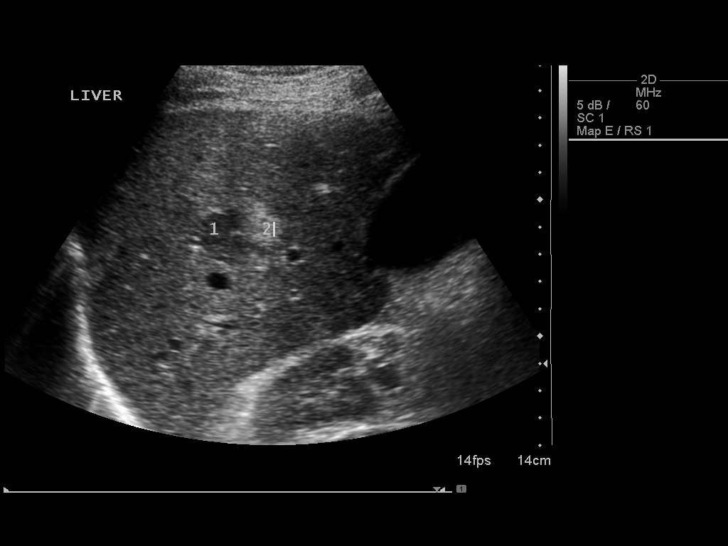
[im 56/96]
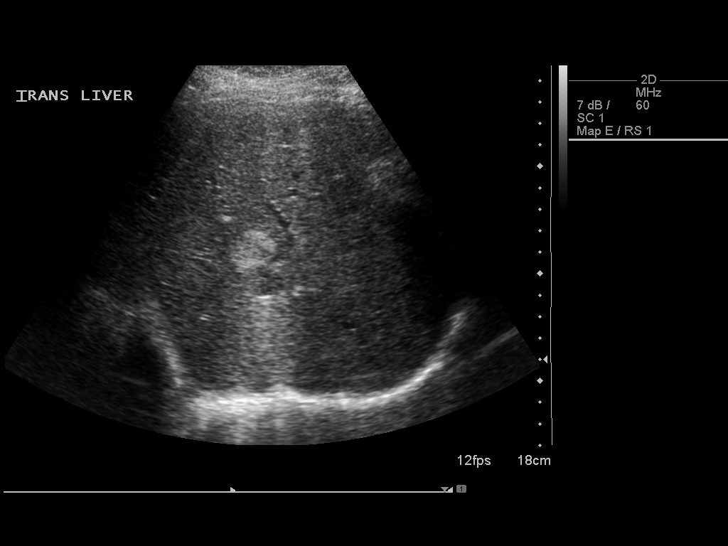
[im 64/96]
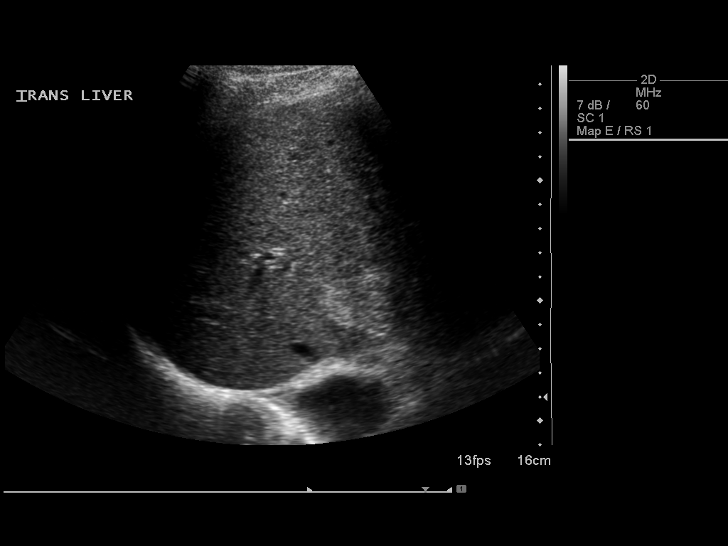
[im 72/96]
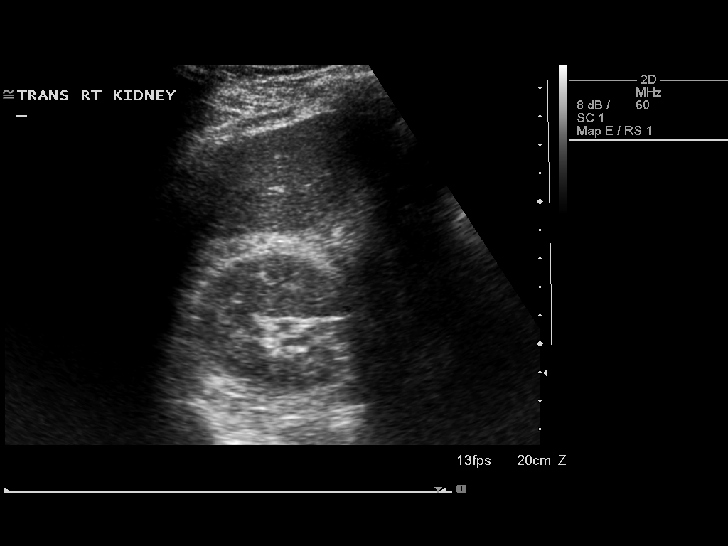
[im 80/96]
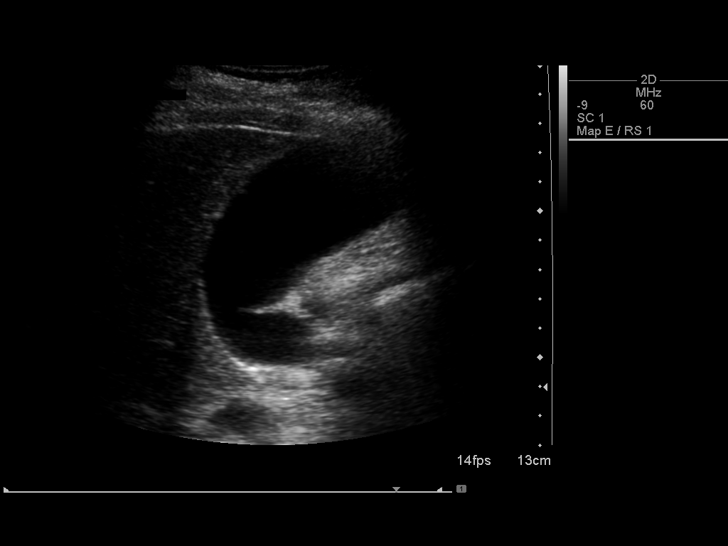
[im 88/96]
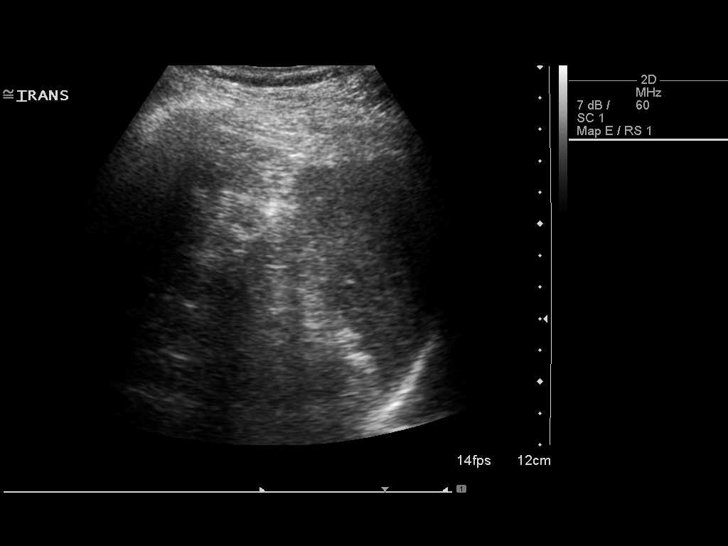
[im 96/96]
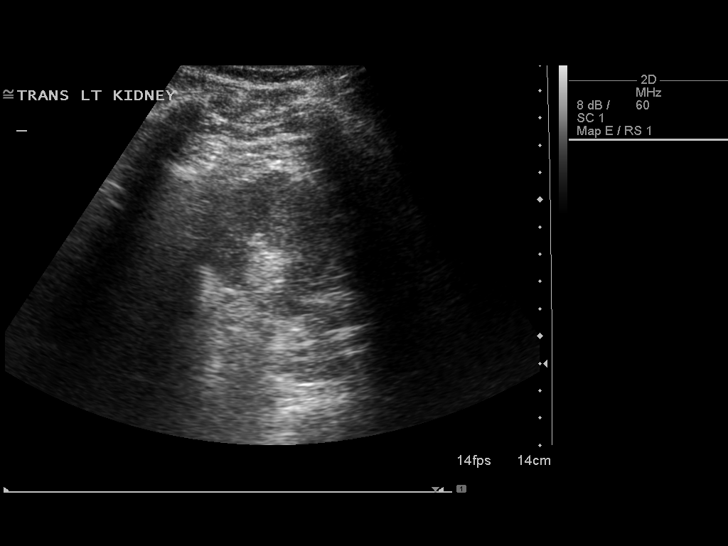

[13 of 25 positions shown; findings below may reference images not displayed]

FINDINGS: Gallbladder:

No gallstones or wall thickening visualized. No sonographic Murphy
sign noted.

Common bile duct:

Diameter: 5.8 mm

Liver:

There is a hyperechoic right hepatic mass measuring 3.4 x 3.4 x
cm likely representing a hemangioma similar in appearance to the
prior exam of 07/29/2006. There is a hyperechoic 2.2 x 1.7 x 1.9 cm
right hepatic mass likely representing a hemangioma also similar in
appearance to the prior exam of 07/29/2006. There is a hypoechoic
2.6 x 2.4 x 2.8 cm right hepatic mass of indeterminate etiology.
Within normal limits in parenchymal echogenicity.

IVC:

No abnormality visualized.

Pancreas:

The pancreatic and is relatively enlarged measuring approximately
4.4 x 3.6 x 3.2 cm with relative hypo echogenicity.

Spleen:

Size and appearance within normal limits.

Right Kidney:

Length: 11.5 cm. Echogenicity within normal limits. No mass or
hydronephrosis visualized.

Left Kidney:

Length: 12.7 cm. Echogenicity within normal limits. No mass or
hydronephrosis visualized.

Abdominal aorta:

No aneurysm visualized.

Other findings:

None.
IMPRESSION: 1. Relatively enlarged pancreatic head which may reflect normal
prominent pancreatic head anatomy versus a pancreatic head mass
versus focal pancreatitis. Correlation with laboratory values
recommended. A dedicated MRI or CT of the abdomen is recommended for
further characterization.
2. There are 2 hyperechoic hepatic mass is most consistent with
hemangioma is. These are similar in appearance when correlated with
prior CT abdomen dated 07/29/2006. There is a hypoechoic right
hepatic mass which is of indeterminate etiology measuring 2.6 x
x 2.8 cm. This can be evaluated at the same time as the pancreas
with an MRI or CT of the abdomen.

## 2015-12-22 ENCOUNTER — Other Ambulatory Visit (HOSPITAL_COMMUNITY): Payer: Self-pay | Admitting: Psychiatry

## 2018-10-24 ENCOUNTER — Other Ambulatory Visit (HOSPITAL_COMMUNITY): Payer: Self-pay | Admitting: General Surgery

## 2018-10-24 DIAGNOSIS — D3A8 Other benign neuroendocrine tumors: Secondary | ICD-10-CM

## 2018-10-27 ENCOUNTER — Other Ambulatory Visit: Payer: Self-pay | Admitting: Urology

## 2018-10-27 DIAGNOSIS — R972 Elevated prostate specific antigen [PSA]: Secondary | ICD-10-CM

## 2018-11-04 ENCOUNTER — Encounter (HOSPITAL_COMMUNITY): Payer: Medicare Other

## 2018-11-04 ENCOUNTER — Encounter (HOSPITAL_COMMUNITY): Payer: Self-pay

## 2018-11-28 ENCOUNTER — Encounter (HOSPITAL_COMMUNITY): Payer: Self-pay

## 2018-11-28 ENCOUNTER — Ambulatory Visit (HOSPITAL_COMMUNITY)
Admission: RE | Admit: 2018-11-28 | Discharge: 2018-11-28 | Disposition: A | Discharge status: Home / Self Care | Payer: Medicare Other | Source: Ambulatory Visit | Attending: General Surgery | Admitting: General Surgery

## 2018-11-28 ENCOUNTER — Other Ambulatory Visit: Payer: Self-pay

## 2018-11-28 DIAGNOSIS — D3A8 Other benign neuroendocrine tumors: Secondary | ICD-10-CM | POA: Insufficient documentation

## 2018-11-28 MED ORDER — GALLIUM GA 68 DOTATATE IV KIT
3.8700 | PACK | Freq: Once | INTRAVENOUS | Status: AC | PRN
  Administered 2018-11-28: 15:00:00 3.87 via INTRAVENOUS

## 2018-12-01 ENCOUNTER — Ambulatory Visit
Admission: RE | Admit: 2018-12-01 | Discharge: 2018-12-01 | Disposition: A | Discharge status: Home / Self Care | Payer: Medicare Other | Source: Ambulatory Visit | Attending: Urology | Admitting: Urology

## 2018-12-01 ENCOUNTER — Other Ambulatory Visit: Payer: Self-pay

## 2018-12-01 DIAGNOSIS — R972 Elevated prostate specific antigen [PSA]: Secondary | ICD-10-CM

## 2018-12-01 MED ORDER — GADOBENATE DIMEGLUMINE 529 MG/ML IV SOLN
15.0000 mL | Freq: Once | INTRAVENOUS | Status: AC | PRN
  Administered 2018-12-01: 15 mL via INTRAVENOUS

## 2018-12-01 NOTE — Progress Notes (Signed)
Please let patient know that the scan was negative for recurrent tumor.

## 2019-05-19 ENCOUNTER — Other Ambulatory Visit: Payer: Self-pay | Admitting: Gastroenterology

## 2019-05-19 ENCOUNTER — Other Ambulatory Visit: Payer: Self-pay

## 2019-05-19 ENCOUNTER — Ambulatory Visit
Admission: RE | Admit: 2019-05-19 | Discharge: 2019-05-19 | Disposition: A | Discharge status: Home / Self Care | Payer: Medicare Other | Source: Ambulatory Visit | Attending: Gastroenterology | Admitting: Gastroenterology

## 2019-05-19 DIAGNOSIS — R1012 Left upper quadrant pain: Secondary | ICD-10-CM

## 2019-05-19 MED ORDER — IOPAMIDOL (ISOVUE-300) INJECTION 61%
100.0000 mL | Freq: Once | INTRAVENOUS | Status: AC | PRN
  Administered 2019-05-19: 13:00:00 100 mL via INTRAVENOUS

## 2020-01-28 ENCOUNTER — Other Ambulatory Visit: Payer: Self-pay

## 2020-01-28 ENCOUNTER — Ambulatory Visit (HOSPITAL_COMMUNITY): Payer: Medicare Other | Admitting: Psychiatry

## 2020-01-28 VITALS — BP 110/69 | HR 80 | Ht 73.0 in | Wt 162.8 lb

## 2020-01-28 DIAGNOSIS — F411 Generalized anxiety disorder: Secondary | ICD-10-CM

## 2020-01-28 MED ORDER — CLONAZEPAM 0.5 MG PO TABS: 0.5000 mg | ORAL_TABLET | Freq: Two times a day (BID) | ORAL | 0 refills | Status: DC

## 2020-01-28 NOTE — Progress Notes (Signed)
.  Has found subcu psychiatric Initial Adult Assessment   Patient Identification: Peter Summers MRN:  [id number] Date of Evaluation:  01/28/2020 Referral Source:[facility] Chief Complaint:   Visit Diagnosis: Adjustment disorder with an anxious mood  History of Present Illness:   This patient is a 79 year old African-American male who is well-known to me.  He has been diagnosed with an anxiety disorder which has been persistent over the years.  Efforts to remove benzodiazepines of "distinct decline in his functioning.  He has been on psychotropic medications for over 50 years.  He seen multiple psychiatrists.  At this time he is fairly stable.  Few years ago he had pancreatic cancer nearly died.  He has survived.  He actually has been cancer free for about 4 years.  Patient has been married for 14 years and has 2 children.  At this time he denies daily depression.  He is sleeping and eating fairly well.  His sleep has changed a little bit and that he is having a hard time staying asleep.  His energy level is good.  He works forty hours a week cutting hair.  He has a good sense of work.  He is not suicidal now and never has been.  He denies use of alcohol really ever.  He did smoke some marijuana up to about 3 years ago and stopped.  Patient has significant GI problems.  Has been having weight loss on a slow basis despite the fact that he does have an appetite.  He has had 2 vaccines for COVID virus.  He has chronic GI complaints mainly of nausea and is under the care of Dr. Collene Mares.  Patient has never had any manic symptoms.  He is never shown any evidence of psychosis.  He does not seem to have generalized anxiety disorder or obsessive-compulsive disorder.  Patient has never been in a psychiatric hospital.  He does have a primary care doctor at this time.  Associated Signs/Symptoms: Depression Symptoms:  insomnia, (Hypo) Manic Symptoms:   Anxiety Symptoms:  Excessive Worry, Psychotic Symptoms:   PTSD  Symptoms:   Past Psychiatric History:   Previous Psychotropic Medications yes  Substance Abuse History in the last 12 months:  none Consequences of Substance Abuse:   Past Medical History:  Past Medical History:  Diagnosis Date  . Anxiety   . Arthritis    ? R shoulder  & L shoulder   . Cancer [facility])    pancreatic cancer-abdominal pain   . Diabetes mellitus    on oral medication - no insulin  . Enlarged prostate   . GERD (gastroesophageal reflux disease)   . Hypertension   . Ventral hernia   . Wrist injury    left- unsure how it occurred     Past Surgical History:  Procedure Laterality Date  . APPENDECTOMY    . ERCP N/A 01/29/2014   Procedure: ENDOSCOPIC RETROGRADE CHOLANGIOPANCREATOGRAPHY (ERCP);  Surgeon: Beryle Beams, MD;  Location: [facility];  Service: Endoscopy;  Laterality: N/A;  . EUS N/A 01/01/2014   Procedure: UPPER ENDOSCOPIC ULTRASOUND (EUS) LINEAR;  Surgeon: Beryle Beams, MD;  Location: [facility];  Service: Endoscopy;  Laterality: N/A;  . HERNIA REPAIR  1980   hiatal hernia  . INSERTION OF MESH N/A 09/28/2015   Procedure: INSERTION OF MESH;  Surgeon: Donnie Mesa, MD;  Location: [place];  Service: General;  Laterality: N/A;  . LUMBAR LAMINECTOMY/DECOMPRESSION MICRODISCECTOMY N/A 04/24/2013   Procedure: CENTRAL DECOMPRESSIVE LUMBAR LAMINECTOMY L3-4 EXCISION OF  SYNOVIAL CYST ON LEFT ;  Surgeon: Tobi Bastos, MD;  Location: [facility];  Service: Orthopedics;  Laterality: N/A;  . PANCREATIC STENT PLACEMENT N/A 01/29/2014   Procedure: PANCREATIC STENT PLACEMENT;  Surgeon: Beryle Beams, MD;  Location: [facility];  Service: Endoscopy;  Laterality: N/A;  . ROTATOR CUFF REPAIR Right   . UMBILICAL HERNIA REPAIR N/A 03/04/2014   Procedure: HERNIA REPAIR UMBILICAL ADULT;  Surgeon: [name], MD;  Location: [facility];  Service: General;  Laterality: N/A;  . VENTRAL HERNIA REPAIR N/A 09/28/2015   Procedure:  [place];  Surgeon: Donnie Mesa,  MD;  Location: [place];  Service: General;  Laterality: N/A;  . WHIPPLE PROCEDURE N/A 03/04/2014   Procedure:  DIAGNOSTIC LAPAROSCOPY, PANCREATICODUODENECTOMY, REMOVAL OF BILIARY STENT, PLACEMENT OF PANCREATIC DUCT STENT;  Surgeon: [name], MD;  Location: [facility];  Service: General;  Laterality: N/A;    Family Psychiatric History:   Family History:  Family History  Problem Relation Age of Onset  . Diabetes Father     Social History:   Social History   Socioeconomic History  . Marital status: Married    Spouse name: Not on file  . Number of children: Not on file  . Years of education: Not on file  . Highest education level: Not on file  Occupational History  . Not on file  Tobacco Use  . Smoking status: Never Smoker  . Smokeless tobacco: Never Used  Substance and Sexual Activity  . Alcohol use: No  . Drug use: Yes    Comment: marijuana   3 x's /weeks ago- last time- April 2017  . Sexual activity: Not on file  Other Topics Concern  . Not on file  Social History Narrative  . Not on file   Social Determinants of Health   Financial Resource Strain:   . Difficulty of Paying Living Expenses: Not on file  Food Insecurity:   . Worried About Charity fundraiser in the Last Year: Not on file  . Ran Out of Food in the Last Year: Not on file  Transportation Needs:   . Lack of Transportation (Medical): Not on file  . Lack of Transportation (Non-Medical): Not on file  Physical Activity:   . Days of Exercise per Week: Not on file  . Minutes of Exercise per Session: Not on file  Stress:   . Feeling of Stress : Not on file  Social Connections:   . Frequency of Communication with Friends and Family: Not on file  . Frequency of Social Gatherings with Friends and Family: Not on file  . Attends Religious Services: Not on file  . Active Member of Clubs or Organizations: Not on file  . Attends Archivist Meetings: Not on file  . Marital Status: Not on file     Additional Social History:   Allergies:   Allergies  Allergen Reactions  . Morphine And Related     hallucinations   . Aspirin Nausea Only and Other (See Comments)    Pt. Feels the mouth is watery & nausea     Metabolic Disorder Labs: Lab Results  Component Value Date   HGBA1C 7.0 (H) 09/20/2015   MPG 154 09/20/2015   No results found for: PROLACTIN No results found for: CHOL, TRIG, HDL, CHOLHDL, VLDL, LDLCALC No results found for: TSH  Therapeutic Level Labs: No results found for: LITHIUM No results found for: CBMZ No results found for: VALPROATE  Current Medications: Current Outpatient  Medications  Medication Sig Dispense Refill  . acetaminophen (TYLENOL) 500 MG tablet Take 1,000 mg by mouth daily as needed for mild pain.    . clonazePAM (KLONOPIN) 1 MG tablet Take 1 mg by mouth 2 (two) times daily.    Marland Kitchen CREON 36000 units CPEP capsule Take 72,000 mg by mouth 3 (three) times daily with meals.   11  . esomeprazole (NEXIUM) 40 MG capsule Take 1 capsule (40 mg total) by mouth daily at 12 noon. 30 capsule 0  . feeding supplement, GLUCERNA SHAKE, (GLUCERNA SHAKE) LIQD Take 237 mLs by mouth 3 (three) times daily between meals. (Patient taking differently: Take 237 mLs by mouth 2 (two) times daily between meals. ) 90 Can 0  . ferrous fumarate (HEMOCYTE - 106 MG FE) 325 (106 FE) MG TABS tablet Take 1 tablet by mouth daily.    . finasteride (PROSCAR) 5 MG tablet Take 5 mg by mouth daily.    Marland Kitchen glipiZIDE (GLUCOTROL XL) 10 MG 24 hr tablet Take 10 mg by mouth daily with breakfast.     . lisinopril-hydrochlorothiazide (PRINZIDE,ZESTORETIC) 20-12.5 MG per tablet Take 1 tablet by mouth every morning.    . metFORMIN (GLUCOPHAGE-XR) 500 MG 24 hr tablet Take 1,000 mg by mouth 2 (two) times daily.     . Multiple Vitamin (MULTIVITAMIN WITH MINERALS) TABS tablet Take 1 tablet by mouth daily.    Marland Kitchen oxyCODONE-acetaminophen (PERCOCET/ROXICET) 5-325 MG tablet Take 1-2 tablets by mouth every 4  (four) hours as needed for moderate pain. 30 tablet 0  . oxyCODONE-acetaminophen (PERCOCET/ROXICET) 5-325 MG tablet Take 1 tablet by mouth every 4 (four) hours as needed for severe pain. 40 tablet 0  . tolterodine (DETROL) 2 MG tablet Take 2 mg by mouth 2 (two) times daily.   11   No current facility-administered medications for this visit.    Musculoskeletal: Strength & Muscle Tone: within normal limits Gait & Station: normal Patient leans: N/A  Psychiatric Specialty Exam: Review of Systems  Blood pressure 110/69, pulse 80, height 6\' 1"  (1.854 m), weight 162 lb 12.8 oz (73.8 kg).Body mass index is 21.48 kg/m.  General Appearance: Casual  Eye Contact:  Good  Speech:  Clear and Coherent  Volume:  Normal  Mood:  Anxious  Affect:  NA  Thought Process:  Coherent  Orientation:  Full (Time, Place, and Person)  Thought Content:  WDL  Suicidal Thoughts:  No  Homicidal Thoughts:  No  Memory:  NA  Judgement:  NA  Insight:  NA  Psychomotor Activity:  Normal  Concentration:    Recall:  Good  Fund of Knowledge:Good  Language: Good  Akathisia:  No  Handed:  Right  AIMS (if indicated):  not done  Assets:  Desire for Improvement  ADL's:  Intact  Cognition: WNL  Sleep:  Good   Screenings:   Assessment and Plan:  At this time the patient is fairly stable.  He has been taking Klonopin 0.5 mg twice daily for well over 5 years.  He is very stable on this agent.  Efforts to reduce his supply to significant anxiety which impact his functioning.  In some ways he has atypical anxiety but is best probable primary diagnosis is generalized anxiety disorder.  It is complicated by having a severe medical problem pancreatic cancer.  He still is physically symptomatic.  This patient will be seen again in 3 months.   Jerral Ralph, MD 9/23/20212:52 PM

## 2020-03-29 ENCOUNTER — Other Ambulatory Visit (HOSPITAL_COMMUNITY): Payer: Self-pay | Admitting: Psychiatry

## 2020-04-04 ENCOUNTER — Telehealth (HOSPITAL_COMMUNITY): Payer: Self-pay | Admitting: *Deleted

## 2020-04-04 NOTE — Telephone Encounter (Signed)
Patient needs refill of Clonazepam 0.5mg .  Next appt 12/8.  Please review.

## 2020-04-05 ENCOUNTER — Other Ambulatory Visit (HOSPITAL_COMMUNITY): Payer: Self-pay | Admitting: Psychiatry

## 2020-04-06 ENCOUNTER — Other Ambulatory Visit (HOSPITAL_COMMUNITY): Payer: Self-pay | Admitting: *Deleted

## 2020-04-06 MED ORDER — CLONAZEPAM 0.5 MG PO TABS: 0.5000 mg | ORAL_TABLET | Freq: Two times a day (BID) | ORAL | 3 refills | Status: DC

## 2020-04-12 ENCOUNTER — Other Ambulatory Visit: Payer: Self-pay | Admitting: General Surgery

## 2020-04-12 DIAGNOSIS — D3A8 Other benign neuroendocrine tumors: Secondary | ICD-10-CM

## 2020-04-13 ENCOUNTER — Ambulatory Visit (HOSPITAL_COMMUNITY): Payer: Medicare Other | Admitting: Psychiatry

## 2020-05-02 ENCOUNTER — Ambulatory Visit
Admission: RE | Admit: 2020-05-02 | Discharge: 2020-05-02 | Disposition: A | Discharge status: Home / Self Care | Payer: Medicare Other | Source: Ambulatory Visit | Attending: General Surgery | Admitting: General Surgery

## 2020-05-02 DIAGNOSIS — D3A8 Other benign neuroendocrine tumors: Secondary | ICD-10-CM

## 2020-05-02 MED ORDER — IOPAMIDOL (ISOVUE-300) INJECTION 61%
100.0000 mL | Freq: Once | INTRAVENOUS | Status: AC | PRN
  Administered 2020-05-02: 100 mL via INTRAVENOUS

## 2020-05-13 ENCOUNTER — Other Ambulatory Visit: Payer: Self-pay

## 2020-05-13 ENCOUNTER — Ambulatory Visit (HOSPITAL_COMMUNITY): Payer: Medicare Other | Admitting: Psychiatry

## 2020-10-18 ENCOUNTER — Ambulatory Visit (INDEPENDENT_AMBULATORY_CARE_PROVIDER_SITE_OTHER): Payer: Medicare Other | Admitting: Psychiatry

## 2020-10-18 ENCOUNTER — Other Ambulatory Visit: Payer: Self-pay

## 2020-10-18 DIAGNOSIS — F411 Generalized anxiety disorder: Secondary | ICD-10-CM

## 2020-10-18 MED ORDER — CLONAZEPAM 0.5 MG PO TABS: 0.5000 mg | ORAL_TABLET | Freq: Two times a day (BID) | ORAL | 3 refills | Status: DC

## 2020-10-18 NOTE — Progress Notes (Signed)
.  Has found subcu psychiatric Initial Adult Assessment   Patient Identification: Peter Summers MRN:  [id number] Date of Evaluation:  10/18/2020 Referral Source:[facility] Chief Complaint:   Visit Diagnosis: Adjustment disorder with an anxious mood  Today the patient is actually doing fairly well.  Has had some trouble finding this location.  His primary care doctor took over some of his prescribed.  The patient is diagnosed with an adjustment disorder with an anxious mood state and at times he meets criteria for generalized anxiety disorder.  Nonetheless we will go ahead and restart his Klonopin at a lower dose of 25 mg twice daily.  The patient drinks no alcohol and uses no drugs.  He is medically fairly stable.  He has a history of pancreatic cancer.  Patient continues to try to work.  He comes here.  The patient is eating only fairly well.  His weight amazingly is stable.  Patient says he feels dizzy and weak but this is really been the case since he has been on the lower dose of Klonopin over the last month or 2.  He does describe significant amount of anxiety.  He does seem to affect his ability to function.  Associated Signs/Symptoms: Depression Symptoms:  insomnia, (Hypo) Manic Symptoms:   Anxiety Symptoms:  Excessive Worry, Psychotic Symptoms:   PTSD Symptoms:   Past Psychiatric History:   Previous Psychotropic Medications yes  Substance Abuse History in the last 12 months:  none Consequences of Substance Abuse:   Past Medical History:  Past Medical History:  Diagnosis Date   Anxiety    Arthritis    ? R shoulder  & L shoulder    Cancer ([place])    pancreatic cancer-abdominal pain    Diabetes mellitus    on oral medication - no insulin   Enlarged prostate    GERD (gastroesophageal reflux disease)    Hypertension    Ventral hernia    Wrist injury    left- unsure how it occurred     Past Surgical History:  Procedure Laterality Date   APPENDECTOMY     ERCP N/A 01/29/2014    Procedure: ENDOSCOPIC RETROGRADE CHOLANGIOPANCREATOGRAPHY (ERCP);  Surgeon: Beryle Beams, MD;  Location: [facility];  Service: Endoscopy;  Laterality: N/A;   EUS N/A 01/01/2014   Procedure: UPPER ENDOSCOPIC ULTRASOUND (EUS) LINEAR;  Surgeon: Beryle Beams, MD;  [facility]: [facility];  Service: Endoscopy;  Laterality: N/A;   HERNIA REPAIR  1980   hiatal hernia   INSERTION OF MESH N/A 09/28/2015   Procedure: INSERTION OF MESH;  Surgeon: Donnie Mesa, MD;  Location: [place];  Service: General;  Laterality: N/A;   LUMBAR LAMINECTOMY/DECOMPRESSION MICRODISCECTOMY N/A 04/24/2013   Procedure: CENTRAL DECOMPRESSIVE LUMBAR LAMINECTOMY L3-4 EXCISION 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 ;  Surgeon: Tobi Bastos, MD;  Location: [facility];  Service: Orthopedics;  Laterality: N/A;   PANCREATIC STENT PLACEMENT N/A 01/29/2014   Procedure: PANCREATIC STENT PLACEMENT;  Surgeon: Beryle Beams, MD;  [facility];  Service: Endoscopy;  Laterality: N/A;   ROTATOR CUFF REPAIR Right    UMBILICAL HERNIA REPAIR N/A 03/04/2014   Procedure: HERNIA REPAIR UMBILICAL ADULT;  Surgeon: [name], MD;  [facility];  Service: General;  Laterality: N/A;   VENTRAL HERNIA REPAIR N/A 09/28/2015   Procedure:  VENTRAL INCISIONAL HERNIA REPAIR;  Surgeon: Donnie Mesa, MD;  Location: [place];  Service: General;  Laterality: N/A;   WHIPPLE PROCEDURE N/A 03/04/2014   Procedure:  DIAGNOSTIC LAPAROSCOPY,  PANCREATICODUODENECTOMY, REMOVAL OF BILIARY STENT, PLACEMENT OF PANCREATIC DUCT STENT;  Surgeon: [name], MD;  [facility]: [facility];  Service: General;  Laterality: N/A;    Family Psychiatric History:   Family History:  Family History  Problem Relation Age of Onset   Diabetes Father     Social History:   Social History   Socioeconomic History   Marital status: Married    Spouse name: Not on file   Number of children: Not on file   Years of education: Not on file   Highest education level: Not on file  Occupational  History   Not on file  Tobacco Use   Smoking status: Never   Smokeless tobacco: Never  Substance and Sexual Activity   Alcohol use: No   Drug use: Yes    Comment: marijuana   3 x's /weeks ago- last time- April 2017   Sexual activity: Not on file  Other Topics Concern   Not on file  Social History Narrative   Not on file   Social Determinants of Health   Financial Resource Strain: Not on file  Food Insecurity: Not on file  Transportation Needs: Not on file  Physical Activity: Not on file  Stress: Not on file  Social Connections: Not on file    Additional Social History:   Allergies:   Allergies  Allergen Reactions   Morphine And Related     hallucinations    Aspirin Nausea Only and Other (See Comments)    Pt. Feels the mouth is watery & nausea     Metabolic Disorder Labs: Lab Results  Component Value Date   HGBA1C 7.0 (H) 09/20/2015   MPG 154 09/20/2015   No results found for: PROLACTIN No results found for: CHOL, TRIG, HDL, CHOLHDL, VLDL, LDLCALC No results found for: TSH  Therapeutic Level Labs: No results found for: LITHIUM No results found for: CBMZ No results found for: VALPROATE  Current Medications: Current Outpatient Medications  Medication Sig Dispense Refill   acetaminophen (TYLENOL) 500 MG tablet Take 1,000 mg by mouth daily as needed for mild pain.     clonazePAM (KLONOPIN) 0.5 MG tablet Take 1 tablet (0.5 mg total) by mouth 2 (two) times daily. 60 tablet 3   CREON 36000 units CPEP capsule Take 72,000 mg by mouth 3 (three) times daily with meals.   11   esomeprazole (NEXIUM) 40 MG capsule Take 1 capsule (40 mg total) by mouth daily at 12 noon. 30 capsule 0   feeding supplement, GLUCERNA SHAKE, (GLUCERNA SHAKE) LIQD Take 237 mLs by mouth 3 (three) times daily between meals. (Patient taking differently: Take 237 mLs by mouth 2 (two) times daily between meals. ) 90 Can 0   ferrous fumarate (HEMOCYTE - 106 MG FE) 325 (106 FE) MG TABS tablet Take 1  tablet by mouth daily.     finasteride (PROSCAR) 5 MG tablet Take 5 mg by mouth daily.     glipiZIDE (GLUCOTROL XL) 10 MG 24 hr tablet Take 10 mg by mouth daily with breakfast.      lisinopril-hydrochlorothiazide (PRINZIDE,ZESTORETIC) 20-12.5 MG per tablet Take 1 tablet by mouth every morning.     metFORMIN (GLUCOPHAGE-XR) 500 MG 24 hr tablet Take 1,000 mg by mouth 2 (two) times daily.      Multiple Vitamin (MULTIVITAMIN WITH MINERALS) TABS tablet Take 1 tablet by mouth daily.     oxyCODONE-acetaminophen (PERCOCET/ROXICET) 5-325 MG tablet Take 1-2 tablets by mouth every 4 (four) hours as needed for moderate pain.  30 tablet 0   oxyCODONE-acetaminophen (PERCOCET/ROXICET) 5-325 MG tablet Take 1 tablet by mouth every 4 (four) hours as needed for severe pain. 40 tablet 0   tolterodine (DETROL) 2 MG tablet Take 2 mg by mouth 2 (two) times daily.   11   No current facility-administered medications for this visit.    Musculoskeletal: Strength & Muscle Tone: within normal limits Gait & Station: normal Patient leans: N/A  Psychiatric Specialty Exam: Review of Systems  There were no vitals taken for this visit.There is no height or weight on file to calculate BMI.  General Appearance: Casual  Eye Contact:  Good  Speech:  Clear and Coherent  Volume:  Normal  Mood:  Anxious  Affect:  NA  Thought Process:  Coherent  Orientation:  Full (Time, Place, and Person)  Thought Content:  WDL  Suicidal Thoughts:  No  Homicidal Thoughts:  No  Memory:  NA  Judgement:  NA  Insight:  NA  Psychomotor Activity:  Normal  Concentration:    Recall:  Good  Fund of Knowledge:Good  Language: Good  Akathisia:  No  Handed:  Right  AIMS (if indicated):  not done  Assets:  Desire for Improvement  ADL's:  Intact  Cognition: WNL  Sleep:  Good   Screenings:   Assessment and Plan: This patient's diagnosis is that of generalized anxiety disorder.  We will go ahead and continue his Klonopin at 0.5 mg twice  daily.  He takes it appropriately.  He has been on well over a decade.  Taking this medicine the patient seems to function much better.   Jerral Ralph, MD 6/14/20223:54 PM

## 2021-01-24 ENCOUNTER — Other Ambulatory Visit: Payer: Self-pay

## 2021-01-24 ENCOUNTER — Ambulatory Visit (INDEPENDENT_AMBULATORY_CARE_PROVIDER_SITE_OTHER): Payer: Medicare Other | Admitting: Psychiatry

## 2021-01-24 DIAGNOSIS — F411 Generalized anxiety disorder: Secondary | ICD-10-CM | POA: Diagnosis not present

## 2021-01-24 MED ORDER — CLONAZEPAM 0.5 MG PO TABS: ORAL_TABLET | ORAL | 4 refills | Status: DC

## 2021-01-24 NOTE — Progress Notes (Signed)
.  Has found subcu psychiatric Initial Adult Assessment   Patient Identification: Peter Summers MRN:  [id number] Date of Evaluation:  01/24/2021 Referral Source:[facility] Chief Complaint:   Visit Diagnosis: Adjustment disorder with an anxious mood  Today the patient is only doing fairly well.  He is not sleeping.  He describes a lot of anxiety but importantly he describes tinnitus.  I shared with him that I do not treat tinnitus and he should talk to his primary care doctor about this.  Overall patient is being treated for pancreatic disease and diabetes.  Today he denies being persistently depressed.  He does describe significant anxiety.  It should be noted that over a year ago he was being treated with Klonopin 1 mg twice daily and over the year I have reduced to 0.5 mg twice daily.  This patient denies any use of alcohol and drugs.  I believe he is not using anything else.  I believe he is physically very impaired.  I diagnosed him with generalized anxiety disorder.  He is a person who chronically worries.  He chronically has muscle tension problems he gets fatigued and is noted to be sleeping very poorly.  Associated Signs/Symptoms: Depression Symptoms:  insomnia, (Hypo) Manic Symptoms:   Anxiety Symptoms:  Excessive Worry, Psychotic Symptoms:   PTSD Symptoms:   Past Psychiatric History:   Previous Psychotropic Medications yes  Substance Abuse History in the last 12 months:  none Consequences of Substance Abuse:   Past Medical History:  Past Medical History:  Diagnosis Date   Anxiety    Arthritis    ? R shoulder  & L shoulder    Cancer ([place])    pancreatic cancer-abdominal pain    Diabetes mellitus    on oral medication - no insulin   Enlarged prostate    GERD (gastroesophageal reflux disease)    Hypertension    Ventral hernia    Wrist injury    left- unsure how it occurred     Past Surgical History:  Procedure Laterality Date   APPENDECTOMY     ERCP N/A 01/29/2014    Procedure: ENDOSCOPIC RETROGRADE CHOLANGIOPANCREATOGRAPHY (ERCP);  Surgeon: Beryle Beams, MD;  Location: [facility];  Service: Endoscopy;  Laterality: N/A;   EUS N/A 01/01/2014   Procedure: UPPER ENDOSCOPIC ULTRASOUND (EUS) LINEAR;  Surgeon: Beryle Beams, MD;  Location: [facility];  Service: Endoscopy;  Laterality: N/A;   HERNIA REPAIR  1980   hiatal hernia   INSERTION OF MESH N/A 09/28/2015   Procedure: INSERTION OF MESH;  Surgeon: Donnie Mesa, MD;  Location: [facility];  Service: General;  Laterality: N/A;   LUMBAR LAMINECTOMY/DECOMPRESSION MICRODISCECTOMY N/A 04/24/2013   Procedure: CENTRAL DECOMPRESSIVE LUMBAR LAMINECTOMY L3-4 EXCISION 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 ;  Surgeon: Tobi Bastos, MD;  Location: [facility];  Service: Orthopedics;  Laterality: N/A;   PANCREATIC STENT PLACEMENT N/A 01/29/2014   Procedure: PANCREATIC STENT PLACEMENT;  Surgeon: Beryle Beams, MD;  Location: [facility];  Service: Endoscopy;  Laterality: N/A;   ROTATOR CUFF REPAIR Right    UMBILICAL HERNIA REPAIR N/A 03/04/2014   Procedure: HERNIA REPAIR UMBILICAL ADULT;  Surgeon: [name], MD;  Location: [facility];  Service: General;  Laterality: N/A;   VENTRAL HERNIA REPAIR N/A 09/28/2015   Procedure:  [place];  Surgeon: Donnie Mesa, MD;  Location: [facility];  Service: General;  Laterality: N/A;   WHIPPLE PROCEDURE N/A 03/04/2014   Procedure:  DIAGNOSTIC LAPAROSCOPY, PANCREATICODUODENECTOMY, REMOVAL OF BILIARY  STENT, PLACEMENT OF PANCREATIC DUCT STENT;  Surgeon: [name], MD;  Location: [facility];  Service: General;  Laterality: N/A;    Family Psychiatric History:   Family History:  Family History  Problem Relation Age of Onset   Diabetes Father     Social History:   Social History   Socioeconomic History   Marital status: Married    Spouse name: Not on file   Number of children: Not on file   Years of education: Not on file   Highest education level: Not on file  Occupational  History   Not on file  Tobacco Use   Smoking status: Never   Smokeless tobacco: Never  Substance and Sexual Activity   Alcohol use: No   Drug use: Yes    Comment: marijuana   3 x's /weeks ago- last time- April 2017   Sexual activity: Not on file  Other Topics Concern   Not on file  Social History Narrative   Not on file   Social Determinants of Health   Financial Resource Strain: Not on file  Food Insecurity: Not on file  Transportation Needs: Not on file  Physical Activity: Not on file  Stress: Not on file  Social Connections: Not on file    Additional Social History:   Allergies:   Allergies  Allergen Reactions   Morphine And Related     hallucinations    Aspirin Nausea Only and Other (See Comments)    Pt. Feels the mouth is watery & nausea     Metabolic Disorder Labs: Lab Results  Component Value Date   HGBA1C 7.0 (H) 09/20/2015   MPG 154 09/20/2015   No results found for: PROLACTIN No results found for: CHOL, TRIG, HDL, CHOLHDL, VLDL, LDLCALC No results found for: TSH  Therapeutic Level Labs: No results found for: LITHIUM No results found for: CBMZ No results found for: VALPROATE  Current Medications: Current Outpatient Medications  Medication Sig Dispense Refill   acetaminophen (TYLENOL) 500 MG tablet Take 1,000 mg by mouth daily as needed for mild pain.     clonazePAM (KLONOPIN) 0.5 MG tablet 1  qam  2  qhs 90 tablet 4   CREON 36000 units CPEP capsule Take 72,000 mg by mouth 3 (three) times daily with meals.   11   esomeprazole (NEXIUM) 40 MG capsule Take 1 capsule (40 mg total) by mouth daily at 12 noon. 30 capsule 0   feeding supplement, GLUCERNA SHAKE, (GLUCERNA SHAKE) LIQD Take 237 mLs by mouth 3 (three) times daily between meals. (Patient taking differently: Take 237 mLs by mouth 2 (two) times daily between meals. ) 90 Can 0   ferrous fumarate (HEMOCYTE - 106 MG FE) 325 (106 FE) MG TABS tablet Take 1 tablet by mouth daily.     finasteride  (PROSCAR) 5 MG tablet Take 5 mg by mouth daily.     glipiZIDE (GLUCOTROL XL) 10 MG 24 hr tablet Take 10 mg by mouth daily with breakfast.      lisinopril-hydrochlorothiazide (PRINZIDE,ZESTORETIC) 20-12.5 MG per tablet Take 1 tablet by mouth every morning.     metFORMIN (GLUCOPHAGE-XR) 500 MG 24 hr tablet Take 1,000 mg by mouth 2 (two) times daily.      Multiple Vitamin (MULTIVITAMIN WITH MINERALS) TABS tablet Take 1 tablet by mouth daily.     oxyCODONE-acetaminophen (PERCOCET/ROXICET) 5-325 MG tablet Take 1-2 tablets by mouth every 4 (four) hours as needed for moderate pain. 30 tablet 0   oxyCODONE-acetaminophen (PERCOCET/ROXICET) 5-325 MG  tablet Take 1 tablet by mouth every 4 (four) hours as needed for severe pain. 40 tablet 0   tolterodine (DETROL) 2 MG tablet Take 2 mg by mouth 2 (two) times daily.   11   No current facility-administered medications for this visit.    Musculoskeletal: Strength & Muscle Tone: within normal limits Gait & Station: normal Patient leans: N/A  Psychiatric Specialty Exam: Review of Systems  There were no vitals taken for this visit.There is no height or weight on file to calculate BMI.  General Appearance: Casual  Eye Contact:  Good  Speech:  Clear and Coherent  Volume:  Normal  Mood:  Anxious  Affect:  NA  Thought Process:  Coherent  Orientation:  Full (Time, Place, and Person)  Thought Content:  WDL  Suicidal Thoughts:  No  Homicidal Thoughts:  No  Memory:  NA  Judgement:  NA  Insight:  NA  Psychomotor Activity:  Normal  Concentration:    Recall:  Good  Fund of Knowledge:Good  Language: Good  Akathisia:  No  Handed:  Right  AIMS (if indicated):  not done  Assets:  Desire for Improvement  ADL's:  Intact  Cognition: WNL  Sleep:  Good   Screenings:   Assessment and Plan:  This patient's diagnosis is generalized anxiety disorder.  Today we will adjust his Klonopin a small amount.  He will take .5 mg 1 in the morning and 2 at night.  This  patient will be seen again in person in 4 months.  Hopefully it will help him sleep reduce his anxiety and maybe even help his tinnitus.  I have asked him to ask his primary care doctor to help him with his treatment for his tinnitus.   Jerral Ralph, MD 9/20/20222:22 PM

## 2021-05-24 ENCOUNTER — Ambulatory Visit (HOSPITAL_COMMUNITY): Payer: Medicare Other | Admitting: Psychiatry

## 2021-06-14 ENCOUNTER — Ambulatory Visit (HOSPITAL_BASED_OUTPATIENT_CLINIC_OR_DEPARTMENT_OTHER): Payer: Medicare Other | Admitting: Psychiatry

## 2021-06-14 ENCOUNTER — Other Ambulatory Visit: Payer: Self-pay

## 2021-06-14 DIAGNOSIS — F411 Generalized anxiety disorder: Secondary | ICD-10-CM

## 2021-06-14 MED ORDER — TRAZODONE HCL 50 MG PO TABS: ORAL_TABLET | ORAL | 6 refills | Status: DC

## 2021-06-14 MED ORDER — CLONAZEPAM 0.5 MG PO TABS: ORAL_TABLET | ORAL | 4 refills | Status: DC

## 2021-06-14 NOTE — Progress Notes (Signed)
.  Has found subcu psychiatric Initial Adult Assessment   Patient Identification: Peter Summers MRN:  [id number] Date of Evaluation:  06/14/2021 Referral Source:[facility] Chief Complaint:   Visit Diagnosis: Adjustment disorder with an anxious mood   Today the patient is doing fairly well.  Patient lives with his wife.  The patient works Wednesday Thursday Friday and Saturday.  The other days he is off.  He watches a lot of TV.  He has a dog.  Overall he is actually doing pretty well.  He denies daily depression or anhedonia.  His only complaint is his sleep.  He go to bed about 1130 and sleeps only about 1 to 2 hours.  It is hard to imagine that he sleeps a little.  He says that he lies in bed and tosses but does not get a good night sleep.  He does not take naps and he does not feel all that tired during the day.  This patient has a history of chronic pancreatitis.  He is on medicines for chronic pancreatitis.  Fortunately he has no abdominal pain.  He does have diabetes and he takes insulin and metformin.  Financially he is doing okay.  He is eating fairly well.  He has no problems thinking or concentrating.  He is turning 81 in 1 week.  Overall his health is reasonably good.  Financially he is stable.  He has a good relationship with his wife.  The patient is to start going back to the gym.  The patient loves where he lives.  Tone a quiet street.  The patient has no evidence of psychotic symptoms.  He drinks no alcohol and uses no drugs.  He takes his medicines just as prescribed.  His only complaint is that he does not sleep well at night.  He has had this problem now for about 4 years.  Fortunately he does not complain of tinnitus today. Associated Signs/Symptoms: Depression Symptoms:  insomnia, (Hypo) Manic Symptoms:   Anxiety Symptoms:  Excessive Worry, Psychotic Symptoms:   PTSD Symptoms:   Past Psychiatric History:   Previous Psychotropic Medications yes  Substance Abuse History in the  last 12 months:  none Consequences of Substance Abuse:   Past Medical History:  Past Medical History:  Diagnosis Date   Anxiety    Arthritis    ? R shoulder  & L shoulder    Cancer ([place])    pancreatic cancer-abdominal pain    Diabetes mellitus    on oral medication - no insulin   Enlarged prostate    GERD (gastroesophageal reflux disease)    Hypertension    Ventral hernia    Wrist injury    left- unsure how it occurred     Past Surgical History:  Procedure Laterality Date   APPENDECTOMY     ERCP N/A 01/29/2014   Procedure: ENDOSCOPIC RETROGRADE CHOLANGIOPANCREATOGRAPHY (ERCP);  Surgeon: Beryle Beams, MD;  Location: [facility];  Service: Endoscopy;  Laterality: N/A;   EUS N/A 01/01/2014   Procedure: UPPER ENDOSCOPIC ULTRASOUND (EUS) LINEAR;  Surgeon: Beryle Beams, MD;  Location: [facility];  Service: Endoscopy;  Laterality: N/A;   HERNIA REPAIR  1980   hiatal hernia   INSERTION OF MESH N/A 09/28/2015   Procedure: INSERTION OF MESH;  Surgeon: Donnie Mesa, MD;  Location: [facility];  Service: General;  Laterality: N/A;   LUMBAR LAMINECTOMY/DECOMPRESSION MICRODISCECTOMY N/A 04/24/2013   Procedure: CENTRAL DECOMPRESSIVE LUMBAR LAMINECTOMY L3-4 EXCISION 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 ;  Surgeon: Tobi Bastos, MD;  Location: [facility];  Service: Orthopedics;  Laterality: N/A;   PANCREATIC STENT PLACEMENT N/A 01/29/2014   Procedure: PANCREATIC STENT PLACEMENT;  Surgeon: Beryle Beams, MD;  [facility]: [facility];  Service: Endoscopy;  Laterality: N/A;   ROTATOR CUFF REPAIR Right    UMBILICAL HERNIA REPAIR N/A 03/04/2014   Procedure: HERNIA REPAIR UMBILICAL ADULT;  Surgeon: [name], MD;  [facility]: [facility];  Service: General;  Laterality: N/A;   VENTRAL HERNIA REPAIR N/A 09/28/2015   Procedure:  [place];  Surgeon: Donnie Mesa, MD;  Location: [facility];  Service: General;  Laterality: N/A;   WHIPPLE PROCEDURE N/A 03/04/2014   Procedure:  DIAGNOSTIC LAPAROSCOPY,  PANCREATICODUODENECTOMY, REMOVAL OF BILIARY STENT, PLACEMENT OF PANCREATIC DUCT STENT;  Surgeon: [name], MD;  Location: [facility];  Service: General;  Laterality: N/A;    Family Psychiatric History:   Family History:  Family History  Problem Relation Age of Onset   Diabetes Father     Social History:   Social History   Socioeconomic History   Marital status: Married    Spouse name: Not on file   Number of children: Not on file   Years of education: Not on file   Highest education level: Not on file  Occupational History   Not on file  Tobacco Use   Smoking status: Never   Smokeless tobacco: Never  Substance and Sexual Activity   Alcohol use: No   Drug use: Yes    Comment: marijuana   3 x's /weeks ago- last time- April 2017   Sexual activity: Not on file  Other Topics Concern   Not on file  Social History Narrative   Not on file   Social Determinants of Health   Financial Resource Strain: Not on file  Food Insecurity: Not on file  Transportation Needs: Not on file  Physical Activity: Not on file  Stress: Not on file  Social Connections: Not on file    Additional Social History:   Allergies:   Allergies  Allergen Reactions   Morphine And Related     hallucinations    Aspirin Nausea Only and Other (See Comments)    Pt. Feels the mouth is watery & nausea     Metabolic Disorder Labs: Lab Results  Component Value Date   HGBA1C 7.0 (H) 09/20/2015   MPG 154 09/20/2015   No results found for: PROLACTIN No results found for: CHOL, TRIG, HDL, CHOLHDL, VLDL, LDLCALC No results found for: TSH  Therapeutic Level Labs: No results found for: LITHIUM No results found for: CBMZ No results found for: VALPROATE  Current Medications: Current Outpatient Medications  Medication Sig Dispense Refill   traZODone (DESYREL) 50 MG tablet 1 qhs for 3 days then may increase to 2  qhs 60 tablet 6   acetaminophen (TYLENOL) 500 MG tablet Take 1,000 mg by mouth daily as  needed for mild pain.     clonazePAM (KLONOPIN) 0.5 MG tablet 1  qam  2  qhs 90 tablet 4   CREON 36000 units CPEP capsule Take 72,000 mg by mouth 3 (three) times daily with meals.   11   esomeprazole (NEXIUM) 40 MG capsule Take 1 capsule (40 mg total) by mouth daily at 12 noon. 30 capsule 0   feeding supplement, GLUCERNA SHAKE, (GLUCERNA SHAKE) LIQD Take 237 mLs by mouth 3 (three) times daily between meals. (Patient taking differently: Take 237 mLs by mouth 2 (two) times daily between meals. )  90 Can 0   ferrous fumarate (HEMOCYTE - 106 MG FE) 325 (106 FE) MG TABS tablet Take 1 tablet by mouth daily.     finasteride (PROSCAR) 5 MG tablet Take 5 mg by mouth daily.     glipiZIDE (GLUCOTROL XL) 10 MG 24 hr tablet Take 10 mg by mouth daily with breakfast.      lisinopril-hydrochlorothiazide (PRINZIDE,ZESTORETIC) 20-12.5 MG per tablet Take 1 tablet by mouth every morning.     metFORMIN (GLUCOPHAGE-XR) 500 MG 24 hr tablet Take 1,000 mg by mouth 2 (two) times daily.      Multiple Vitamin (MULTIVITAMIN WITH MINERALS) TABS tablet Take 1 tablet by mouth daily.     oxyCODONE-acetaminophen (PERCOCET/ROXICET) 5-325 MG tablet Take 1-2 tablets by mouth every 4 (four) hours as needed for moderate pain. 30 tablet 0   oxyCODONE-acetaminophen (PERCOCET/ROXICET) 5-325 MG tablet Take 1 tablet by mouth every 4 (four) hours as needed for severe pain. 40 tablet 0   tolterodine (DETROL) 2 MG tablet Take 2 mg by mouth 2 (two) times daily.   11   No current facility-administered medications for this visit.    Musculoskeletal: Strength & Muscle Tone: within normal limits Gait & Station: normal Patient leans: N/A  Psychiatric Specialty Exam: Review of Systems  There were no vitals taken for this visit.There is no height or weight on file to calculate BMI.  General Appearance: Casual  Eye Contact:  Good  Speech:  Clear and Coherent  Volume:  Normal  Mood:  Anxious  Affect:  NA  Thought Process:  Coherent   Orientation:  Full (Time, Place, and Person)  Thought Content:  WDL  Suicidal Thoughts:  No  Homicidal Thoughts:  No  Memory:  NA  Judgement:  NA  Insight:  NA  Psychomotor Activity:  Normal  Concentration:    Recall:  Good  Fund of Knowledge:Good  Language: Good  Akathisia:  No  Handed:  Right  AIMS (if indicated):  not done  Assets:  Desire for Improvement  ADL's:  Intact  Cognition: WNL  Sleep:  Good   Screenings:   Assessment and Plan:  This patient's first problem is that of generalized anxiety disorder.  He continues to have chronic mild anxiety but it is fairly well controlled taking Klonopin 0.5 mg 1 in the morning and 2 at night.  He will continue on this agent.  His second problem is insomnia.  He will begin on trazodone 50 mg and if one does not work then we will take a second 1.  I will always quite stable.  He is functioning well.  His anxiety is reasonably well controlled.  My hope is to help him get to sleep.  This patient she will return to see me in 3 months.   Jerral Ralph, MD 2/8/20233:13 PM

## 2021-09-12 ENCOUNTER — Ambulatory Visit (HOSPITAL_BASED_OUTPATIENT_CLINIC_OR_DEPARTMENT_OTHER): Payer: Medicare Other | Admitting: Psychiatry

## 2021-09-12 DIAGNOSIS — F411 Generalized anxiety disorder: Secondary | ICD-10-CM

## 2021-09-12 MED ORDER — TRAZODONE HCL 50 MG PO TABS: ORAL_TABLET | ORAL | 6 refills | Status: DC

## 2021-09-12 MED ORDER — CLONAZEPAM 0.5 MG PO TABS: ORAL_TABLET | ORAL | 4 refills | Status: DC

## 2021-09-12 NOTE — Progress Notes (Signed)
.  Has found subcu psychiatric Initial Adult Assessment  ? ?Patient Identification: Peter Summers ?MRN:  [id number] ?Date of Evaluation:  09/12/2021 ?Referral Source:[facility] ?Chief Complaint:   ?Visit Diagnosis: Adjustment disorder with an anxious mood ? ? ? ?Today the patient is doing very well.  He is stable.  He is going to the gym more more.  He still works 4 days out of the week.  He has no plans to retire.  He now sleeping much better.  The addition of the trazodone has been very helpful.  His chronic anxiety is much better controlled.  His mood is better.  Getting a good night sleep has helped him a lot.  He has more energy.  He denies any use of alcohol uses no drugs.  The patient has a good family.  He has 6 siblings all of whom are alive and well.  This patient is 75 and he feels much younger.  He is got good energy.  He stays busy.  He has a good relationship with his wife.  He has no evidence of psychosis.  His concentration is improved.  He takes his medicines just as prescribed. ?Associated Signs/Symptoms: ?Depression Symptoms:  insomnia, ?(Hypo) Manic Symptoms:   ?Anxiety Symptoms:  Excessive Worry, ?Psychotic Symptoms:   ?PTSD Symptoms: ? ? ?Past Psychiatric History:  ? ?Previous Psychotropic Medications yes ? ?Substance Abuse History in the last 12 months:  none ?Consequences of Substance Abuse: ? ? ?Past Medical History:  ?Past Medical History:  ?Diagnosis Date  ? Anxiety   ? Arthritis   ? ? R shoulder  & L shoulder   ? Cancer [facility])   ? pancreatic cancer-abdominal pain   ? Diabetes mellitus   ? on oral medication - no insulin  ? Enlarged prostate   ? GERD (gastroesophageal reflux disease)   ? Hypertension   ? Ventral hernia   ? Wrist injury   ? left- unsure how it occurred   ?  ?Past Surgical History:  ?Procedure Laterality Date  ? APPENDECTOMY    ? ERCP N/A 01/29/2014  ? Procedure: ENDOSCOPIC RETROGRADE CHOLANGIOPANCREATOGRAPHY (ERCP);  Surgeon: Beryle Beams, MD;  Location: [facility];  Service:  Endoscopy;  Laterality: N/A;  ? EUS N/A 01/01/2014  ? Procedure: UPPER ENDOSCOPIC ULTRASOUND (EUS) LINEAR;  Surgeon: Beryle Beams, MD;  [facility];  Service: Endoscopy;  Laterality: N/A;  ? HERNIA REPAIR  1980  ? hiatal hernia  ? INSERTION OF MESH N/A 09/28/2015  ? Procedure: INSERTION OF MESH;  Surgeon: Donnie Mesa, MD;  Location: [place];  Service: General;  Laterality: N/A;  ? LUMBAR LAMINECTOMY/DECOMPRESSION MICRODISCECTOMY N/A 04/24/2013  ? Procedure: CENTRAL DECOMPRESSIVE LUMBAR LAMINECTOMY L3-4 EXCISION 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 ;  Surgeon: Tobi Bastos, MD;  [facility];  Service: Orthopedics;  Laterality: N/A;  ? PANCREATIC STENT PLACEMENT N/A 01/29/2014  ? Procedure: PANCREATIC STENT PLACEMENT;  Surgeon: Beryle Beams, MD;  [facility];  Service: Endoscopy;  Laterality: N/A;  ? ROTATOR CUFF REPAIR Right   ? UMBILICAL HERNIA REPAIR N/A 03/04/2014  ? Procedure: HERNIA REPAIR UMBILICAL ADULT;  Surgeon: [name], MD;  [facility];  Service: General;  Laterality: N/A;  ? VENTRAL HERNIA REPAIR N/A 09/28/2015  ? Procedure:  VENTRAL INCISIONAL HERNIA REPAIR;  Surgeon: Donnie Mesa, MD;  Location: [place];  Service: General;  Laterality: N/A;  ? WHIPPLE PROCEDURE N/A 03/04/2014  ? Procedure:  DIAGNOSTIC LAPAROSCOPY, PANCREATICODUODENECTOMY, REMOVAL OF BILIARY STENT, PLACEMENT OF  PANCREATIC DUCT STENT;  Surgeon: [name], MD;  [facility];  Service: General;  Laterality: N/A;  ? ? ?Family Psychiatric History:  ? ?Family History:  ?Family History  ?Problem Relation Age of Onset  ? Diabetes Father   ? ? ?Social History:   ?Social History  ? ?Socioeconomic History  ? Marital status: Married  ?  Spouse name: Not on file  ? Number of children: Not on file  ? Years of education: Not on file  ? Highest education level: Not on file  ?Occupational History  ? Not on file  ?Tobacco Use  ? Smoking status: Never  ? Smokeless tobacco: Never  ?Substance and Sexual Activity  ? Alcohol  use: No  ? Drug use: Yes  ?  Comment: marijuana   3 x's [place] ago- last time- April 2017  ? Sexual activity: Not on file  ?Other Topics Concern  ? Not on file  ?Social History Narrative  ? Not on file  ? ?Social Determinants of Health  ? ?Financial Resource Strain: Not on file  ?Food Insecurity: Not on file  ?Transportation Needs: Not on file  ?Physical Activity: Not on file  ?Stress: Not on file  ?Social Connections: Not on file  ? ? ?Additional Social History:  ? ?Allergies:   ?Allergies  ?Allergen Reactions  ? Morphine And Related   ?  hallucinations ?  ? Aspirin Nausea Only and Other (See Comments)  ?  Pt. Feels the mouth is watery & nausea   ? ? ?Metabolic Disorder Labs: ?Lab Results  ?Component Value Date  ? HGBA1C 7.0 (H) 09/20/2015  ? MPG 154 09/20/2015  ? ?No results found for: PROLACTIN ?No results found for: CHOL, TRIG, HDL, CHOLHDL, VLDL, LDLCALC ?No results found for: TSH ? ?Therapeutic Level Labs: ?No results found for: LITHIUM ?No results found for: CBMZ ?No results found for: VALPROATE ? ?Current Medications: ?Current Outpatient Medications  ?Medication Sig Dispense Refill  ? acetaminophen (TYLENOL) 500 MG tablet Take 1,000 mg by mouth daily as needed for mild pain.    ? clonazePAM (KLONOPIN) 0.5 MG tablet 1  qam  2  qhs 90 tablet 4  ? CREON 36000 units CPEP capsule Take 72,000 mg by mouth 3 (three) times daily with meals.   11  ? esomeprazole (NEXIUM) 40 MG capsule Take 1 capsule (40 mg total) by mouth daily at 12 noon. 30 capsule 0  ? feeding supplement, GLUCERNA SHAKE, (GLUCERNA SHAKE) LIQD Take 237 mLs by mouth 3 (three) times daily between meals. (Patient taking differently: Take 237 mLs by mouth 2 (two) times daily between meals. ) 90 Can 0  ? ferrous fumarate (HEMOCYTE - 106 MG FE) 325 (106 FE) MG TABS tablet Take 1 tablet by mouth daily.    ? finasteride (PROSCAR) 5 MG tablet Take 5 mg by mouth daily.    ? glipiZIDE (GLUCOTROL XL) 10 MG 24 hr tablet Take 10 mg by mouth daily with breakfast.      ? lisinopril-hydrochlorothiazide (PRINZIDE,ZESTORETIC) 20-12.5 MG per tablet Take 1 tablet by mouth every morning.    ? metFORMIN (GLUCOPHAGE-XR) 500 MG 24 hr tablet Take 1,000 mg by mouth 2 (two) times daily.     ? Multiple Vitamin (MULTIVITAMIN WITH MINERALS) TABS tablet Take 1 tablet by mouth daily.    ? oxyCODONE-acetaminophen (PERCOCET/ROXICET) 5-325 MG tablet Take 1-2 tablets by mouth every 4 (four) hours as needed for moderate pain. 30 tablet 0  ? oxyCODONE-acetaminophen (PERCOCET/ROXICET) 5-325 MG tablet Take 1  tablet by mouth every 4 (four) hours as needed for severe pain. 40 tablet 0  ? tolterodine (DETROL) 2 MG tablet Take 2 mg by mouth 2 (two) times daily.   11  ? traZODone (DESYREL) 50 MG tablet 1 qhs for 3 days then may increase to 2  qhs 60 tablet 6  ? ?No current facility-administered medications for this visit.  ? ? ?Musculoskeletal: ?Strength & Muscle Tone: within normal limits ?Gait & Station: normal ?Patient leans: N/A ? ?Psychiatric Specialty Exam: ?Review of Systems  ?There were no vitals taken for this visit.There is no height or weight on file to calculate BMI.  ?General Appearance: Casual  ?Eye Contact:  Good  ?Speech:  Clear and Coherent  ?Volume:  Normal  ?Mood:  Anxious  ?Affect:  NA  ?Thought Process:  Coherent  ?Orientation:  Full (Time, Place, and Person)  ?Thought Content:  WDL  ?Suicidal Thoughts:  No  ?Homicidal Thoughts:  No  ?Memory:  NA  ?Judgement:  NA  ?Insight:  NA  ?Psychomotor Activity:  Normal  ?Concentration:    ?Recall:  Good  ?Fund of [place]  ?Language: Good  ?Akathisia:  No  ?Handed:  Right  ?AIMS (if indicated):  not done  ?Assets:  Desire for Improvement  ?ADL's:  Intact  ?Cognition: WNL  ?Sleep:  Good  ? ?Screenings: ? ? ?Assessment and Plan: ? ?This patient's first problem is generalized anxiety disorder.  His worrying is much less.  His anxiety is well controlled.  Now he is sleeping much better.  Takes Klonopin 0.5 mg 1 in the morning and 2 at night.   His associated problem is that of insomnia.  He takes trazodone 50 mg 1 or 2 and it works very well.  Patient is very stable and will return to see me in 4 months. ? ? ?Jerral Ralph, MD ?09/12/2021

## 2022-01-16 ENCOUNTER — Ambulatory Visit (HOSPITAL_COMMUNITY): Payer: Medicare Other | Admitting: Psychiatry

## 2022-01-16 DIAGNOSIS — F411 Generalized anxiety disorder: Secondary | ICD-10-CM

## 2022-01-16 MED ORDER — CLONAZEPAM 0.5 MG PO TABS: ORAL_TABLET | ORAL | 4 refills | Status: DC

## 2022-01-16 MED ORDER — TRAZODONE HCL 50 MG PO TABS: ORAL_TABLET | ORAL | 6 refills | Status: DC

## 2022-01-16 NOTE — Progress Notes (Signed)
.  Has found subcu psychiatric Initial Adult Assessment   Patient Identification: Peter Summers MRN:  [id number] Date of Evaluation:  01/16/2022 Referral Source:[facility] Chief Complaint:   Visit Diagnosis: Adjustment disorder with an anxious mood   Today the patient is notably upset.  He is frustrated with his marriage.  He says his wife does not talk to him and has been like this over 5 years.  He continues to go to work and he continues to go to the gym but he comes home and his house is empty.  He is very frustrated.  His plans are to leave to go to [facility] for a visit.  Overall he continues taking Klonopin and trazodone as prescribed.  Generally he is sleeping fairly well and eating fairly well.  He is very frustrated with his wife's situation.   Associated Signs/Symptoms: Depression Symptoms:  insomnia, (Hypo) Manic Symptoms:   Anxiety Symptoms:  Excessive Worry, Psychotic Symptoms:   PTSD Symptoms:   Past Psychiatric History:   Previous Psychotropic Medications yes  Substance Abuse History in the last 12 months:  none Consequences of Substance Abuse:   Past Medical History:  Past Medical History:  Diagnosis Date   Anxiety    Arthritis    ? R shoulder  & L shoulder    Cancer ([place])    pancreatic cancer-abdominal pain    Diabetes mellitus    on oral medication - no insulin   Enlarged prostate    GERD (gastroesophageal reflux disease)    Hypertension    Ventral hernia    Wrist injury    left- unsure how it occurred     Past Surgical History:  Procedure Laterality Date   APPENDECTOMY     ERCP N/A 01/29/2014   Procedure: ENDOSCOPIC RETROGRADE CHOLANGIOPANCREATOGRAPHY (ERCP);  Surgeon: Beryle Beams, MD;  Location: [facility];  Service: Endoscopy;  Laterality: N/A;   EUS N/A 01/01/2014   Procedure: UPPER ENDOSCOPIC ULTRASOUND (EUS) LINEAR;  Surgeon: Beryle Beams, MD;  [facility];  Service: Endoscopy;  Laterality: N/A;   HERNIA REPAIR  1980   hiatal  hernia   INSERTION OF MESH N/A 09/28/2015   Procedure: INSERTION OF MESH;  Surgeon: Donnie Mesa, MD;  Location: [place];  Service: General;  Laterality: N/A;   LUMBAR LAMINECTOMY/DECOMPRESSION MICRODISCECTOMY N/A 04/24/2013   Procedure: CENTRAL DECOMPRESSIVE LUMBAR LAMINECTOMY L3-4 EXCISION 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 ;  Surgeon: Tobi Bastos, MD;  [facility];  Service: Orthopedics;  Laterality: N/A;   PANCREATIC STENT PLACEMENT N/A 01/29/2014   Procedure: PANCREATIC STENT PLACEMENT;  Surgeon: Beryle Beams, MD;  [facility];  Service: Endoscopy;  Laterality: N/A;   ROTATOR CUFF REPAIR Right    UMBILICAL HERNIA REPAIR N/A 03/04/2014   Procedure: HERNIA REPAIR UMBILICAL ADULT;  Surgeon: [name], MD;  [facility];  Service: General;  Laterality: N/A;   VENTRAL HERNIA REPAIR N/A 09/28/2015   Procedure:  [place];  Surgeon: Donnie Mesa, MD;  Location: [place];  Service: General;  Laterality: N/A;   WHIPPLE PROCEDURE N/A 03/04/2014   Procedure:  DIAGNOSTIC LAPAROSCOPY, PANCREATICODUODENECTOMY, REMOVAL OF BILIARY STENT, PLACEMENT OF PANCREATIC DUCT STENT;  Surgeon: [name], MD;  [facility];  Service: General;  Laterality: N/A;    Family Psychiatric History:   Family History:  Family History  Problem Relation Age of Onset   Diabetes Father     Social History:   Social History   Socioeconomic History  Marital status: Married    Spouse name: Not on file   Number of children: Not on file   Years of education: Not on file   Highest education level: Not on file  Occupational History   Not on file  Tobacco Use   Smoking status: Never   Smokeless tobacco: Never  Substance and Sexual Activity   Alcohol use: No   Drug use: Yes    Comment: marijuana   3 x's /weeks ago- last time- April 2017   Sexual activity: Not on file  Other Topics Concern   Not on file  Social History Narrative   Not on file   Social Determinants of  Health   Financial Resource Strain: Not on file  Food Insecurity: Not on file  Transportation Needs: Not on file  Physical Activity: Not on file  Stress: Not on file  Social Connections: Not on file    Additional Social History:   Allergies:   Allergies  Allergen Reactions   Morphine And Related     hallucinations    Aspirin Nausea Only and Other (See Comments)    Pt. Feels the mouth is watery & nausea     Metabolic Disorder Labs: Lab Results  Component Value Date   HGBA1C 7.0 (H) 09/20/2015   MPG 154 09/20/2015   No results found for: "PROLACTIN" No results found for: "CHOL", "TRIG", "HDL", "CHOLHDL", "VLDL", "LDLCALC" No results found for: "TSH"  Therapeutic Level Labs: No results found for: "LITHIUM" No results found for: "CBMZ" No results found for: "VALPROATE"  Current Medications: Current Outpatient Medications  Medication Sig Dispense Refill   acetaminophen (TYLENOL) 500 MG tablet Take 1,000 mg by mouth daily as needed for mild pain.     clonazePAM (KLONOPIN) 0.5 MG tablet 1  qam  2  qhs 90 tablet 4   CREON 36000 units CPEP capsule Take 72,000 mg by mouth 3 (three) times daily with meals.   11   esomeprazole (NEXIUM) 40 MG capsule Take 1 capsule (40 mg total) by mouth daily at 12 noon. 30 capsule 0   feeding supplement, GLUCERNA SHAKE, (GLUCERNA SHAKE) LIQD Take 237 mLs by mouth 3 (three) times daily between meals. (Patient taking differently: Take 237 mLs by mouth 2 (two) times daily between meals. ) 90 Can 0   ferrous fumarate (HEMOCYTE - 106 MG FE) 325 (106 FE) MG TABS tablet Take 1 tablet by mouth daily.     finasteride (PROSCAR) 5 MG tablet Take 5 mg by mouth daily.     glipiZIDE (GLUCOTROL XL) 10 MG 24 hr tablet Take 10 mg by mouth daily with breakfast.      lisinopril-hydrochlorothiazide (PRINZIDE,ZESTORETIC) 20-12.5 MG per tablet Take 1 tablet by mouth every morning.     metFORMIN (GLUCOPHAGE-XR) 500 MG 24 hr tablet Take 1,000 mg by mouth 2 (two) times  daily.      Multiple Vitamin (MULTIVITAMIN WITH MINERALS) TABS tablet Take 1 tablet by mouth daily.     oxyCODONE-acetaminophen (PERCOCET/ROXICET) 5-325 MG tablet Take 1-2 tablets by mouth every 4 (four) hours as needed for moderate pain. 30 tablet 0   oxyCODONE-acetaminophen (PERCOCET/ROXICET) 5-325 MG tablet Take 1 tablet by mouth every 4 (four) hours as needed for severe pain. 40 tablet 0   tolterodine (DETROL) 2 MG tablet Take 2 mg by mouth 2 (two) times daily.   11   traZODone (DESYREL) 50 MG tablet 1 qhs for 3 days then may increase to 2  qhs 60 tablet 6  No current facility-administered medications for this visit.    Musculoskeletal: Strength & Muscle Tone: within normal limits Gait & Station: normal Patient leans: N/A  Psychiatric Specialty Exam: Review of Systems  There were no vitals taken for this visit.There is no height or weight on file to calculate BMI.  General Appearance: Casual  Eye Contact:  Good  Speech:  Clear and Coherent  Volume:  Normal  Mood:  Anxious  Affect:  NA  Thought Process:  Coherent  Orientation:  Full (Time, Place, and Person)  Thought Content:  WDL  Suicidal Thoughts:  No  Homicidal Thoughts:  No  Memory:  NA  Judgement:  NA  Insight:  NA  Psychomotor Activity:  Normal  Concentration:    Recall:  Good  Fund of Knowledge:Good  Language: Good  Akathisia:  No  Handed:  Right  AIMS (if indicated):  not done  Assets:  Desire for Improvement  ADL's:  Intact  Cognition: WNL  Sleep:  Good   Screenings:   Assessment and Plan:   This patient is going to be referred to a therapist therapy group called [facility].  Patient continue his medications and return to see me in about 2 to 3 months.  Patient is not suicidal.   Jerral Ralph, MD 9/12/20233:03 PM

## 2022-03-21 ENCOUNTER — Ambulatory Visit (HOSPITAL_BASED_OUTPATIENT_CLINIC_OR_DEPARTMENT_OTHER): Payer: Medicare Other | Admitting: Psychiatry

## 2022-03-21 DIAGNOSIS — F411 Generalized anxiety disorder: Secondary | ICD-10-CM

## 2022-03-21 MED ORDER — CLONAZEPAM 0.5 MG PO TABS: ORAL_TABLET | ORAL | 4 refills | Status: DC

## 2022-03-21 MED ORDER — TRAZODONE HCL 50 MG PO TABS: ORAL_TABLET | ORAL | 6 refills | Status: DC

## 2022-03-21 NOTE — Progress Notes (Signed)
.  Has found subcu psychiatric Initial Adult Assessment   Patient Identification: Peter Summers MRN:  [id number] Date of Evaluation:  03/21/2022 Referral Source:[facility] Chief Complaint:   Visit Diagnosis: Adjustment disorder with an anxious mood  Today the patient is doing well.  This seems to be less complicated issues with his wife.  Patient is working regularly.  He does have some physical complaints related to his hands.  Unfortunately just recently saw his primary care doctor and forgot to bring it up.  Patient overall is doing well from.  He is sleeping and eating well.  His anxiety is well controlled.  He takes a low-dose of Klonopin on a regular basis.  He takes trazodone.  He is eating well.  He firmly denies use of alcohol or drugs.  The patient likes what he does for work.  He is functioning very well.    Associated Signs/Symptoms: Depression Symptoms:  insomnia, (Hypo) Manic Symptoms:   Anxiety Symptoms:  Excessive Worry, Psychotic Symptoms:   PTSD Symptoms:   Past Psychiatric History:   Previous Psychotropic Medications yes  Substance Abuse History in the last 12 months:  none Consequences of Substance Abuse:   Past Medical History:  Past Medical History:  Diagnosis Date   Anxiety    Arthritis    ? R shoulder  & L shoulder    Cancer ([place])    pancreatic cancer-abdominal pain    Diabetes mellitus    on oral medication - no insulin   Enlarged prostate    GERD (gastroesophageal reflux disease)    Hypertension    Ventral hernia    Wrist injury    left- unsure how it occurred     Past Surgical History:  Procedure Laterality Date   APPENDECTOMY     ERCP N/A 01/29/2014   Procedure: ENDOSCOPIC RETROGRADE CHOLANGIOPANCREATOGRAPHY (ERCP);  Surgeon: Beryle Beams, MD;  Location: [facility];  Service: Endoscopy;  Laterality: N/A;   EUS N/A 01/01/2014   Procedure: UPPER ENDOSCOPIC ULTRASOUND (EUS) LINEAR;  Surgeon: Beryle Beams, MD;  [facility];   Service: Endoscopy;  Laterality: N/A;   HERNIA REPAIR  1980   hiatal hernia   INSERTION OF MESH N/A 09/28/2015   Procedure: INSERTION OF MESH;  Surgeon: Donnie Mesa, MD;  Location: [place];  Service: General;  Laterality: N/A;   LUMBAR LAMINECTOMY/DECOMPRESSION MICRODISCECTOMY N/A 04/24/2013   Procedure: CENTRAL DECOMPRESSIVE LUMBAR LAMINECTOMY L3-4 EXCISION 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 ;  Surgeon: Tobi Bastos, MD;  [facility];  Service: Orthopedics;  Laterality: N/A;   PANCREATIC STENT PLACEMENT N/A 01/29/2014   Procedure: PANCREATIC STENT PLACEMENT;  Surgeon: Beryle Beams, MD;  [facility];  Service: Endoscopy;  Laterality: N/A;   ROTATOR CUFF REPAIR Right    UMBILICAL HERNIA REPAIR N/A 03/04/2014   Procedure: HERNIA REPAIR UMBILICAL ADULT;  Surgeon: [name], MD;  [facility];  Service: General;  Laterality: N/A;   VENTRAL HERNIA REPAIR N/A 09/28/2015   Procedure:  [place];  Surgeon: Donnie Mesa, MD;  Location: [place];  Service: General;  Laterality: N/A;   WHIPPLE PROCEDURE N/A 03/04/2014   Procedure:  DIAGNOSTIC LAPAROSCOPY, PANCREATICODUODENECTOMY, REMOVAL OF BILIARY STENT, PLACEMENT OF PANCREATIC DUCT STENT;  Surgeon: [name], MD;  [facility];  Service: General;  Laterality: N/A;    Family Psychiatric History:   Family History:  Family History  Problem Relation Age of Onset   Diabetes Father     Social History:  Social History   Socioeconomic History   Marital status: Married    Spouse name: Not on file   Number of children: Not on file   Years of education: Not on file   Highest education level: Not on file  Occupational History   Not on file  Tobacco Use   Smoking status: Never   Smokeless tobacco: Never  Substance and Sexual Activity   Alcohol use: No   Drug use: Yes    Comment: marijuana   3 x's /weeks ago- last time- April 2017   Sexual activity: Not on file  Other Topics Concern   Not on file   Social History Narrative   Not on file   Social Determinants of Health   Financial Resource Strain: Not on file  Food Insecurity: Not on file  Transportation Needs: Not on file  Physical Activity: Not on file  Stress: Not on file  Social Connections: Not on file    Additional Social History:   Allergies:   Allergies  Allergen Reactions   Morphine And Related     hallucinations    Aspirin Nausea Only and Other (See Comments)    Pt. Feels the mouth is watery & nausea     Metabolic Disorder Labs: Lab Results  Component Value Date   HGBA1C 7.0 (H) 09/20/2015   MPG 154 09/20/2015   No results found for: "PROLACTIN" No results found for: "CHOL", "TRIG", "HDL", "CHOLHDL", "VLDL", "LDLCALC" No results found for: "TSH"  Therapeutic Level Labs: No results found for: "LITHIUM" No results found for: "CBMZ" No results found for: "VALPROATE"  Current Medications: Current Outpatient Medications  Medication Sig Dispense Refill   acetaminophen (TYLENOL) 500 MG tablet Take 1,000 mg by mouth daily as needed for mild pain.     clonazePAM (KLONOPIN) 0.5 MG tablet 1  qam  2  qhs 90 tablet 4   CREON 36000 units CPEP capsule Take 72,000 mg by mouth 3 (three) times daily with meals.   11   esomeprazole (NEXIUM) 40 MG capsule Take 1 capsule (40 mg total) by mouth daily at 12 noon. 30 capsule 0   feeding supplement, GLUCERNA SHAKE, (GLUCERNA SHAKE) LIQD Take 237 mLs by mouth 3 (three) times daily between meals. (Patient taking differently: Take 237 mLs by mouth 2 (two) times daily between meals. ) 90 Can 0   ferrous fumarate (HEMOCYTE - 106 MG FE) 325 (106 FE) MG TABS tablet Take 1 tablet by mouth daily.     finasteride (PROSCAR) 5 MG tablet Take 5 mg by mouth daily.     glipiZIDE (GLUCOTROL XL) 10 MG 24 hr tablet Take 10 mg by mouth daily with breakfast.      lisinopril-hydrochlorothiazide (PRINZIDE,ZESTORETIC) 20-12.5 MG per tablet Take 1 tablet by mouth every morning.     metFORMIN  (GLUCOPHAGE-XR) 500 MG 24 hr tablet Take 1,000 mg by mouth 2 (two) times daily.      Multiple Vitamin (MULTIVITAMIN WITH MINERALS) TABS tablet Take 1 tablet by mouth daily.     oxyCODONE-acetaminophen (PERCOCET/ROXICET) 5-325 MG tablet Take 1-2 tablets by mouth every 4 (four) hours as needed for moderate pain. 30 tablet 0   oxyCODONE-acetaminophen (PERCOCET/ROXICET) 5-325 MG tablet Take 1 tablet by mouth every 4 (four) hours as needed for severe pain. 40 tablet 0   tolterodine (DETROL) 2 MG tablet Take 2 mg by mouth 2 (two) times daily.   11   traZODone (DESYREL) 50 MG tablet 1 qhs for 3 days then may increase  to 2  qhs 60 tablet 6   No current facility-administered medications for this visit.    Musculoskeletal: Strength & Muscle Tone: within normal limits Gait & Station: normal Patient leans: N/A  Psychiatric Specialty Exam: Review of Systems  There were no vitals taken for this visit.There is no height or weight on file to calculate BMI.  General Appearance: Casual  Eye Contact:  Good  Speech:  Clear and Coherent  Volume:  Normal  Mood:  Anxious  Affect:  NA  Thought Process:  Coherent  Orientation:  Full (Time, Place, and Person)  Thought Content:  WDL  Suicidal Thoughts:  No  Homicidal Thoughts:  No  Memory:  NA  Judgement:  NA  Insight:  NA  Psychomotor Activity:  Normal  Concentration:    Recall:  Good  Fund of Knowledge:Good  Language: Good  Akathisia:  No  Handed:  Right  AIMS (if indicated):  not done  Assets:  Desire for Improvement  ADL's:  Intact  Cognition: WNL  Sleep:  Good   Screenings:   Assessment and Plan:   This patient's diagnosis is generalized anxiety disorder.  Patient continues taking Klonopin as prescribed second problem is insomnia.  He will continue taking doxepin as prescribed.  Patient is acting very stable.  He will return to see me in 5 months.   Jerral Ralph, MD 11/15/20232:37 PM

## 2022-04-30 IMAGING — CT CT ABD-PELV W/ CM
1 of 3 series · 13 of 32 positions shown, 19 images · IV contrast (APPLIED)
Comparison: 05/19/2019

CLINICAL DATA: Primary pancreatic neuroendocrine tumor.

EXAM:
CT ABDOMEN AND PELVIS WITH CONTRAST
TECHNIQUE: Multidetector CT imaging of the abdomen and pelvis was performed
using the standard protocol following bolus administration of
intravenous contrast.
Creatinine was obtained on site at [HOSPITAL] at [HOSPITAL].
Results: Creatinine 0.8 mg/dL.
CONTRAST:  100mL [id number] IOPAMIDOL ([id number]) INJECTION 61%

[Series 2: abd/pelvis w/cm · axial · 0.69mm/px · z∈[-420,-20]mm · 13 of 94 slices shown, 19 images]
[im 7/94  soft-tissue]
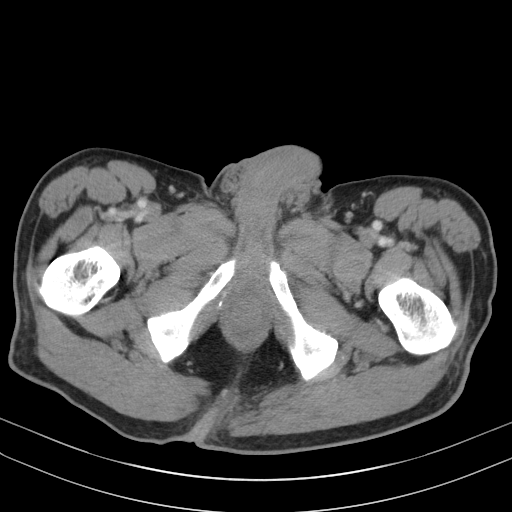
[im 7/94  bone]
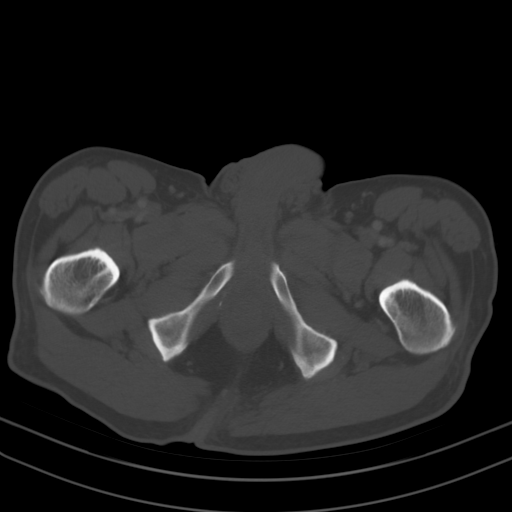
[im 14/94  soft-tissue]
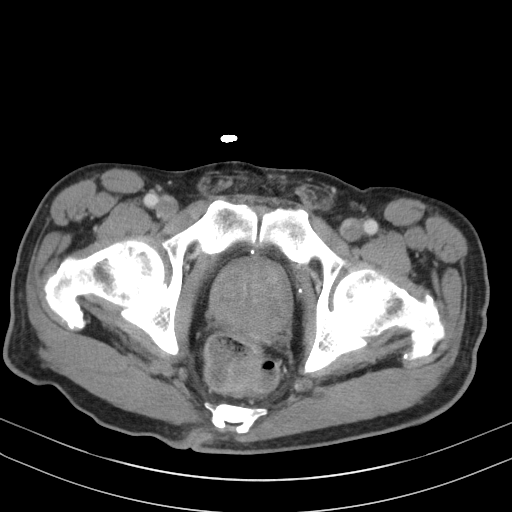
[im 20/94  soft-tissue]
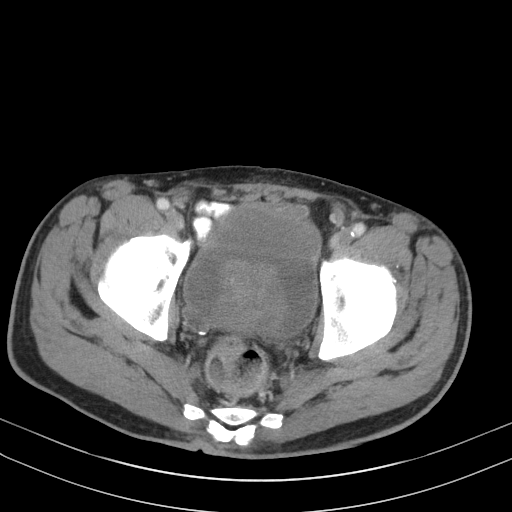
[im 27/94  soft-tissue]
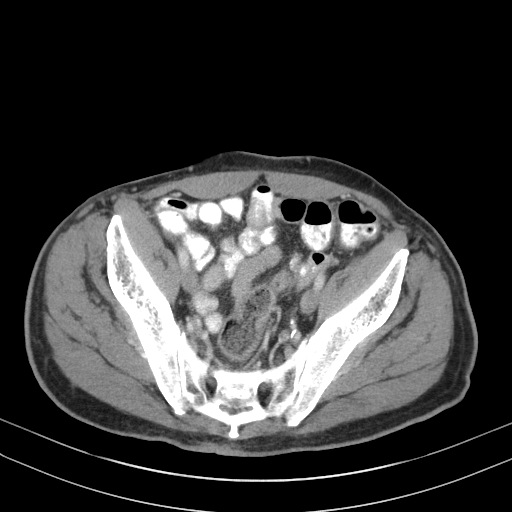
[im 34/94  soft-tissue]
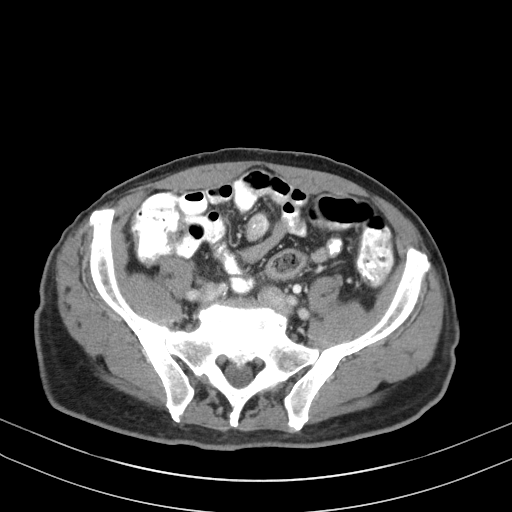
[im 40/94  soft-tissue]
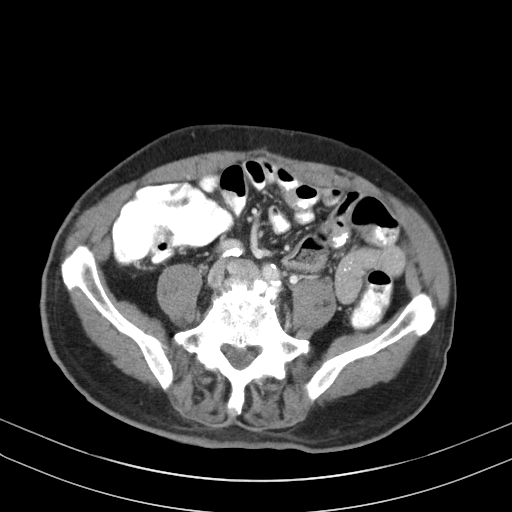
[im 47/94  soft-tissue]
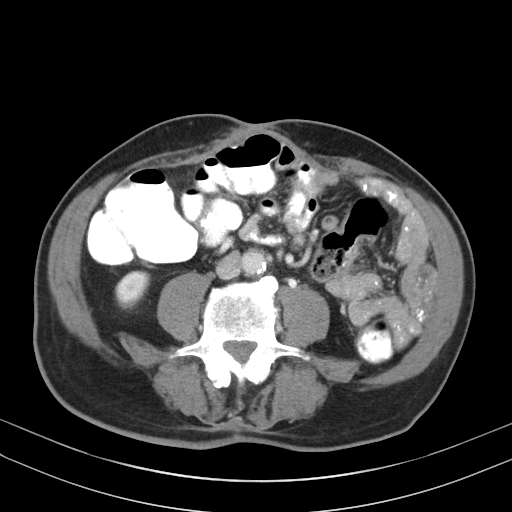
[im 54/94  soft-tissue]
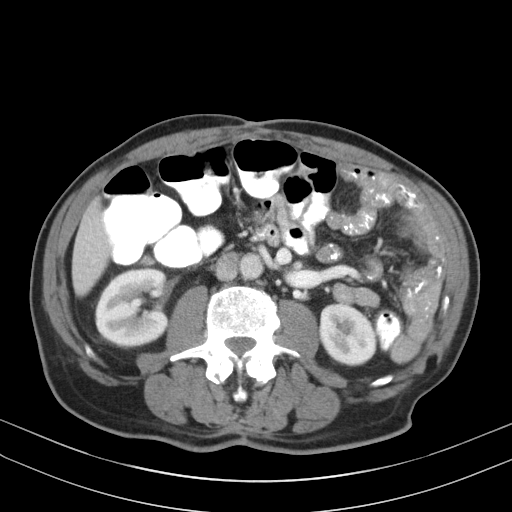
[im 60/94  soft-tissue]
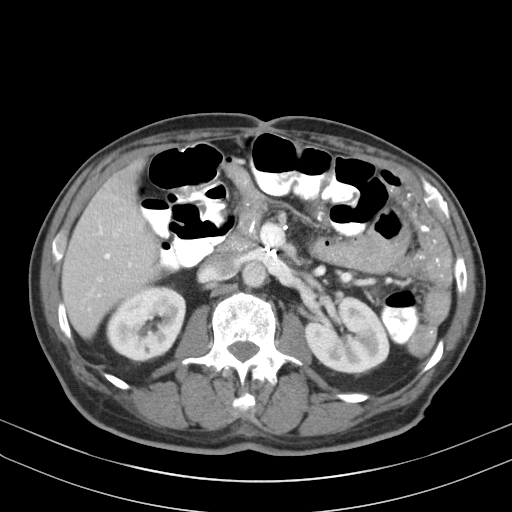
[im 60/94  bone]
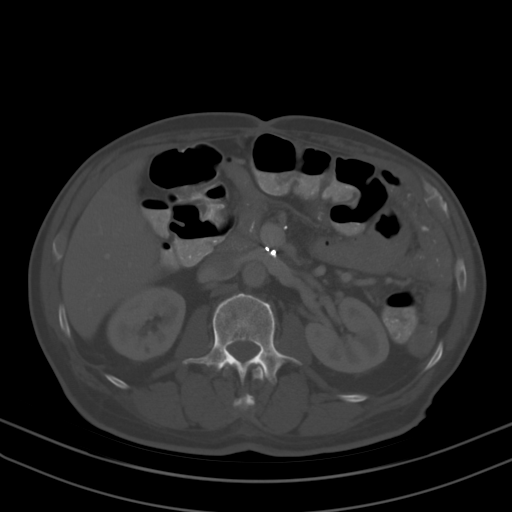
[im 67/94  soft-tissue]
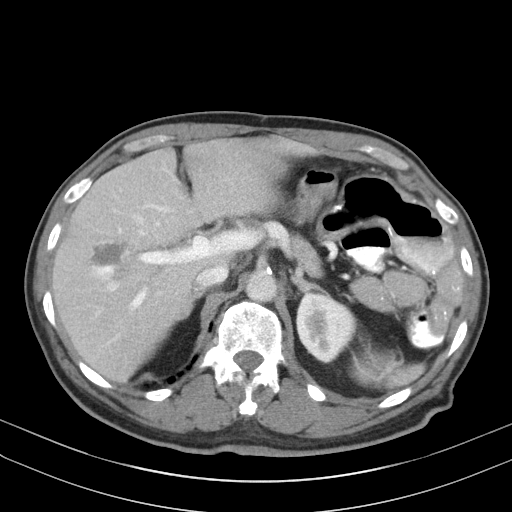
[im 67/94  lung]
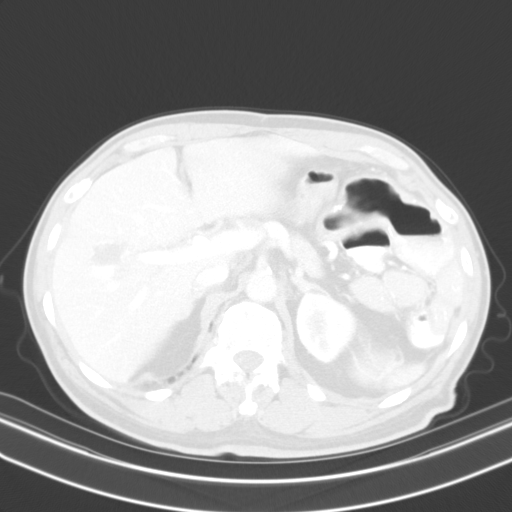
[im 74/94  soft-tissue]
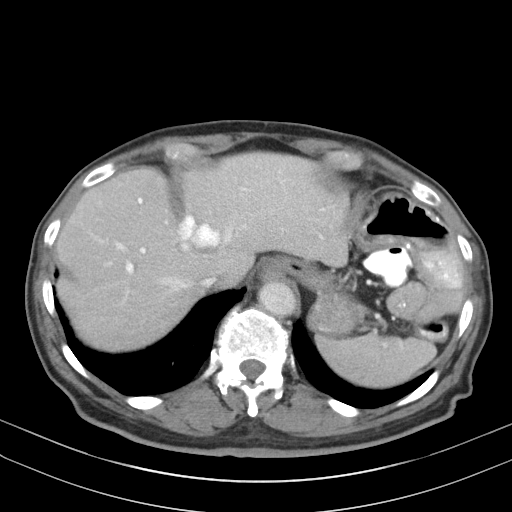
[im 74/94  lung]
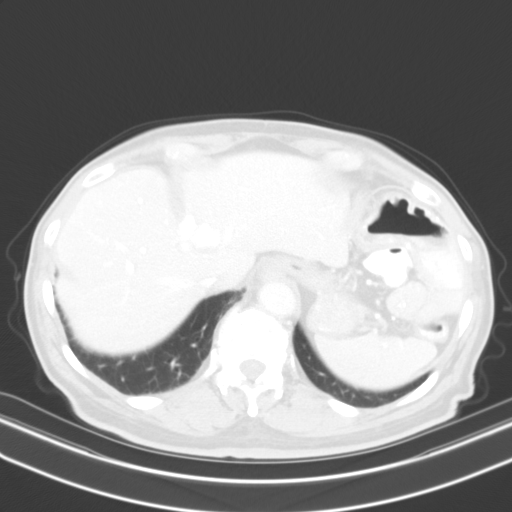
[im 80/94  soft-tissue]
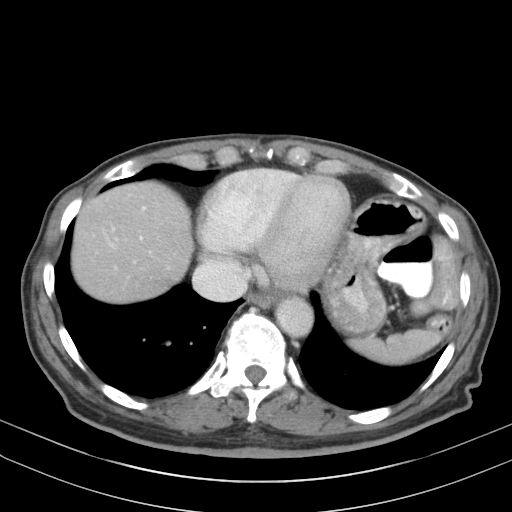
[im 80/94  lung]
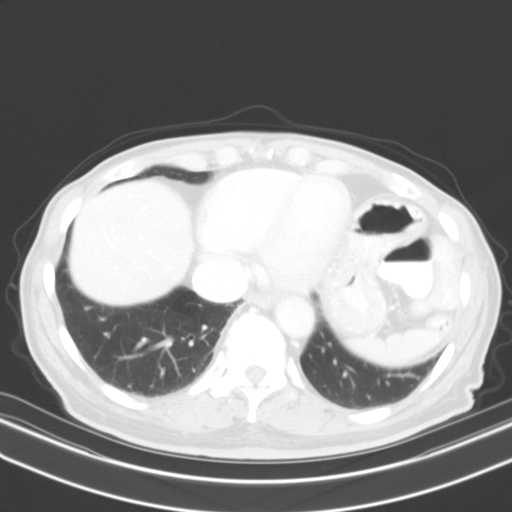
[im 87/94  soft-tissue]
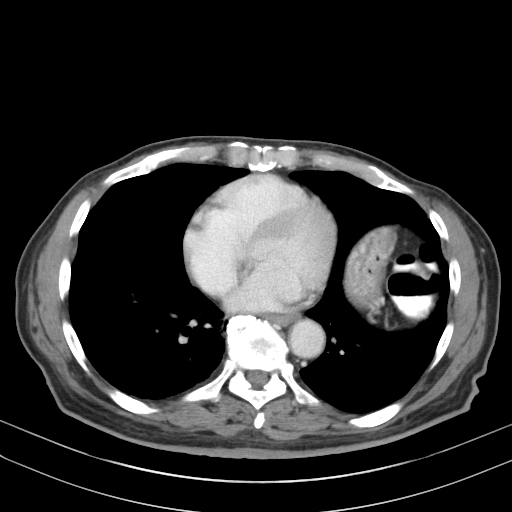
[im 87/94  lung]
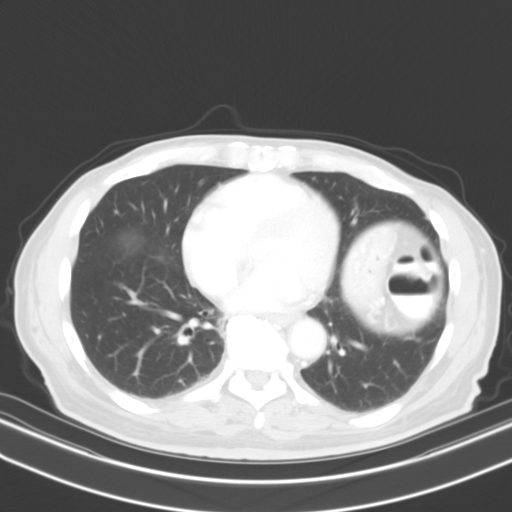

[13 of 32 positions shown; findings below may reference images not displayed]

FINDINGS: Lower chest: No acute abnormality.

Hepatobiliary: Similar appearance of benign liver hemangiomas. The
largest is in the right hepatic lobe measuring 3.9 cm, image [DATE].
No suspicious liver lesions. Cholecystectomy. Pneumobilia identified
compatible with biliary patency.

Pancreas: Postoperative changes from Whipple. No signs of main duct
dilatation or inflammation. No evidence for recurrent mass.

Spleen: Normal in size without focal abnormality.

Adrenals/Urinary Tract: Normal appearance of the adrenal glands. No
kidney mass or hydronephrosis. Mild diffuse bladder wall thickening
is identified.

Stomach/Bowel: Stomach is nondistended. No bowel wall thickening,
inflammation, or distension.

Vascular/Lymphatic: Aortic atherosclerosis. No aneurysm. No
abdominopelvic adenopathy.

Reproductive: Prostate gland is enlarged with mass effect upon the
bladder base.

Other: No free fluid or fluid collections.

Musculoskeletal: No acute or suspicious osseous findings.
Degenerative disc disease is identified within the thoracolumbar
spine. Most advanced at L4-5.
IMPRESSION: 1. Status post Whipple. No evidence for recurrent tumor or
metastatic disease.
2. Stable benign liver hemangiomas.
3. Prostate gland enlargement with mass effect upon the bladder base
and mild diffuse bladder wall thickening
4. Aortic Atherosclerosis ([id number]).

## 2022-05-27 ENCOUNTER — Other Ambulatory Visit (HOSPITAL_COMMUNITY): Payer: Self-pay | Admitting: Psychiatry

## 2022-08-21 ENCOUNTER — Ambulatory Visit (HOSPITAL_COMMUNITY): Payer: Medicare Other | Admitting: Psychiatry

## 2022-08-21 DIAGNOSIS — F411 Generalized anxiety disorder: Secondary | ICD-10-CM | POA: Diagnosis not present

## 2022-08-21 MED ORDER — CLONAZEPAM 0.5 MG PO TABS: ORAL_TABLET | ORAL | 4 refills | Status: DC

## 2022-08-21 MED ORDER — TRAZODONE HCL 50 MG PO TABS: ORAL_TABLET | ORAL | 6 refills | Status: DC

## 2022-08-21 NOTE — Progress Notes (Signed)
.  Has found subcu psychiatric Initial Adult Assessment   Patient Identification: Peter Summers MRN:  [id number] Date of Evaluation:  08/21/2022 Referral Source:[facility] Chief Complaint:   Visit Diagnosis: Adjustment disorder with an anxious mood  Today the patient is doing well.  He has no complaints.  He does have a lot of GI issues and he has some moderate pain from this.  The patient continues to work regularly.  Klonopin is very helpful for him.  His sleeping medicine doxepin works well.  He is eating okay.  He has not lost any weight.  He denies use of alcohol or drugs.  The patient is actually functioning quite well.  He will return to see me in 5 months.    Associated Signs/Symptoms: Depression Symptoms:  insomnia, (Hypo) Manic Symptoms:   Anxiety Symptoms:  Excessive Worry, Psychotic Symptoms:   PTSD Symptoms:   Past Psychiatric History:   Previous Psychotropic Medications yes  Substance Abuse History in the last 12 months:  none Consequences of Substance Abuse:   Past Medical History:  Past Medical History:  Diagnosis Date   Anxiety    Arthritis    ? R shoulder  & L shoulder    Cancer (HCC)    pancreatic cancer-abdominal pain    Diabetes mellitus    on oral medication - no insulin   Enlarged prostate    GERD (gastroesophageal reflux disease)    Hypertension    Ventral hernia    Wrist injury    left- unsure how it occurred     Past Surgical History:  Procedure Laterality Date   APPENDECTOMY     ERCP N/A 01/29/2014   Procedure: ENDOSCOPIC RETROGRADE CHOLANGIOPANCREATOGRAPHY (ERCP);  Surgeon: Theda Belfast, MD;  Location: [facility];  Service: Endoscopy;  Laterality: N/A;   EUS N/A 01/01/2014   Procedure: UPPER ENDOSCOPIC ULTRASOUND (EUS) LINEAR;  Surgeon: Theda Belfast, MD;  Location: [facility];  Service: Endoscopy;  Laterality: N/A;   HERNIA REPAIR  1980   hiatal hernia   INSERTION OF MESH N/A 09/28/2015   Procedure: INSERTION OF MESH;  Surgeon:  Manus Rudd, MD;  Location: [facility];  Service: General;  Laterality: N/A;   LUMBAR LAMINECTOMY/DECOMPRESSION MICRODISCECTOMY N/A 04/24/2013   Procedure: CENTRAL DECOMPRESSIVE LUMBAR LAMINECTOMY L3-4 EXCISION 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 ;  Surgeon: Jacki Cones, MD;  Location: [facility];  Service: Orthopedics;  Laterality: N/A;   PANCREATIC STENT PLACEMENT N/A 01/29/2014   Procedure: PANCREATIC STENT PLACEMENT;  Surgeon: Theda Belfast, MD;  Location: [facility];  Service: Endoscopy;  Laterality: N/A;   ROTATOR CUFF REPAIR Right    UMBILICAL HERNIA REPAIR N/A 03/04/2014   Procedure: HERNIA REPAIR UMBILICAL ADULT;  Surgeon: Almond Lint, MD;  Location: [facility];  Service: General;  Laterality: N/A;   VENTRAL HERNIA REPAIR N/A 09/28/2015   Procedure:  VENTRAL INCISIONAL HERNIA REPAIR;  Surgeon: Manus Rudd, MD;  Location: [facility];  Service: General;  Laterality: N/A;   WHIPPLE PROCEDURE N/A 03/04/2014   Procedure:  DIAGNOSTIC LAPAROSCOPY, PANCREATICODUODENECTOMY, REMOVAL OF BILIARY STENT, PLACEMENT OF PANCREATIC DUCT STENT;  Surgeon: Almond Lint, MD;  Location: [facility];  Service: General;  Laterality: N/A;    Family Psychiatric History:   Family History:  Family History  Problem Relation Age of Onset   Diabetes Father     Social History:   Social History   Socioeconomic History   Marital status: Married    Spouse name: Not on file   Number of  children: Not on file   Years of education: Not on file   Highest education level: Not on file  Occupational History   Not on file  Tobacco Use   Smoking status: Never   Smokeless tobacco: Never  Substance and Sexual Activity   Alcohol use: No   Drug use: Yes    Comment: marijuana   3 x's /weeks ago- last time- April 2017   Sexual activity: Not on file  Other Topics Concern   Not on file  Social History Narrative   Not on file   Social Determinants of Health   Financial Resource Strain: Not on file  Food Insecurity: Not on file   Transportation Needs: Not on file  Physical Activity: Not on file  Stress: Not on file  Social Connections: Not on file    Additional Social History:   Allergies:   Allergies  Allergen Reactions   Morphine And Related     hallucinations    Aspirin Nausea Only and Other (See Comments)    Pt. Feels the mouth is watery & nausea     Metabolic Disorder Labs: Lab Results  Component Value Date   HGBA1C 7.0 (H) 09/20/2015   MPG 154 09/20/2015   No results found for: "PROLACTIN" No results found for: "CHOL", "TRIG", "HDL", "CHOLHDL", "VLDL", "LDLCALC" No results found for: "TSH"  Therapeutic Level Labs: No results found for: "LITHIUM" No results found for: "CBMZ" No results found for: "VALPROATE"  Current Medications: Current Outpatient Medications  Medication Sig Dispense Refill   acetaminophen (TYLENOL) 500 MG tablet Take 1,000 mg by mouth daily as needed for mild pain.     clonazePAM (KLONOPIN) 0.5 MG tablet 1  qam  2  qhs 90 tablet 4   CREON 36000 units CPEP capsule Take 72,000 mg by mouth 3 (three) times daily with meals.   11   esomeprazole (NEXIUM) 40 MG capsule Take 1 capsule (40 mg total) by mouth daily at 12 noon. 30 capsule 0   feeding supplement, GLUCERNA SHAKE, (GLUCERNA SHAKE) LIQD Take 237 mLs by mouth 3 (three) times daily between meals. (Patient taking differently: Take 237 mLs by mouth 2 (two) times daily between meals. ) 90 Can 0   ferrous fumarate (HEMOCYTE - 106 MG FE) 325 (106 FE) MG TABS tablet Take 1 tablet by mouth daily.     finasteride (PROSCAR) 5 MG tablet Take 5 mg by mouth daily.     glipiZIDE (GLUCOTROL XL) 10 MG 24 hr tablet Take 10 mg by mouth daily with breakfast.      lisinopril-hydrochlorothiazide (PRINZIDE,ZESTORETIC) 20-12.5 MG per tablet Take 1 tablet by mouth every morning.     metFORMIN (GLUCOPHAGE-XR) 500 MG 24 hr tablet Take 1,000 mg by mouth 2 (two) times daily.      Multiple Vitamin (MULTIVITAMIN WITH MINERALS) TABS tablet Take 1  tablet by mouth daily.     oxyCODONE-acetaminophen (PERCOCET/ROXICET) 5-325 MG tablet Take 1-2 tablets by mouth every 4 (four) hours as needed for moderate pain. 30 tablet 0   oxyCODONE-acetaminophen (PERCOCET/ROXICET) 5-325 MG tablet Take 1 tablet by mouth every 4 (four) hours as needed for severe pain. 40 tablet 0   tolterodine (DETROL) 2 MG tablet Take 2 mg by mouth 2 (two) times daily.   11   traZODone (DESYREL) 50 MG tablet 1 qhs for 3 days then may increase to 2  qhs 60 tablet 6   No current facility-administered medications for this visit.    Musculoskeletal: Strength & Muscle  Tone: within normal limits Gait & Station: normal Patient leans: N/A  Psychiatric Specialty Exam: Review of Systems  There were no vitals taken for this visit.There is no height or weight on file to calculate BMI.  General Appearance: Casual  Eye Contact:  Good  Speech:  Clear and Coherent  Volume:  Normal  Mood:  Anxious  Affect:  NA  Thought Process:  Coherent  Orientation:  Full (Time, Place, and Person)  Thought Content:  WDL  Suicidal Thoughts:  No  Homicidal Thoughts:  No  Memory:  NA  Judgement:  NA  Insight:  NA  Psychomotor Activity:  Normal  Concentration:    Recall:  Good  Fund of Knowledge:Good  Language: Good  Akathisia:  No  Handed:  Right  AIMS (if indicated):  not done  Assets:  Desire for Improvement  ADL's:  Intact  Cognition: WNL  Sleep:  Good   Screenings:   Assessment and Plan:  This patient's diagnosis is generalized anxiety disorder.  He continues taking Klonopin 1 in the morning and 2 at night.  His second problem is insomnia.  He continues taking doxepin.  The patient is functioning well.  He will return to see me in approximately 5 months.   Gypsy Balsam, MD 4/16/20241:41 PM

## 2023-01-22 ENCOUNTER — Other Ambulatory Visit: Payer: Self-pay

## 2023-01-22 ENCOUNTER — Encounter (HOSPITAL_COMMUNITY): Payer: Self-pay | Admitting: Psychiatry

## 2023-01-22 ENCOUNTER — Ambulatory Visit (HOSPITAL_BASED_OUTPATIENT_CLINIC_OR_DEPARTMENT_OTHER): Payer: Medicare Other | Admitting: Psychiatry

## 2023-01-22 VITALS — BP 127/73 | HR 15 | Ht 73.0 in | Wt 170.0 lb

## 2023-01-22 DIAGNOSIS — F411 Generalized anxiety disorder: Secondary | ICD-10-CM | POA: Diagnosis not present

## 2023-01-22 MED ORDER — CLONAZEPAM 0.5 MG PO TABS: ORAL_TABLET | ORAL | 4 refills | Status: DC

## 2023-01-22 MED ORDER — TRAZODONE HCL 100 MG PO TABS: ORAL_TABLET | ORAL | 5 refills | Status: DC

## 2023-01-22 NOTE — Progress Notes (Signed)
.  Has found subcu psychiatric Initial Adult Assessment   Patient Identification: Peter Summers MRN:  [id number] Date of Evaluation:  01/22/2023 Referral Source:[facility] Chief Complaint:   Visit Diagnosis: Adjustment disorder with an anxious mood  Today the patient is fairly stable.  He still having a lot of problems sleeping.  Despite being on trazodone 100 mg he still only gets 2 or 3 hours of sleep.  He goes to bed at 11 and gets up at 2.  Generally he continues to work.  He works for 5 days a week and does okay.  He is eating well.  He has a pancreatic condition and is having a lot of pain from it as well.  Patient denies depression.  Uses no drugs and no alcohol.  He is very compliant with his Klonopin taking 1 mg 1 in the morning and 2 at night.  He is not suicidal.  He has never had any evidence of psychosis.  His diagnosis is that of generalized anxiety disorder which is fairly well-controlled with Klonopin.  Associated Signs/Symptoms: Depression Symptoms:  insomnia, (Hypo) Manic Symptoms:   Anxiety Symptoms:  Excessive Worry, Psychotic Symptoms:   PTSD Symptoms:   Past Psychiatric History:   Previous Psychotropic Medications yes  Substance Abuse History in the last 12 months:  none Consequences of Substance Abuse:   Past Medical History:  Past Medical History:  Diagnosis Date   Anxiety    Arthritis    ? R shoulder  & L shoulder    Cancer (HCC)    pancreatic cancer-abdominal pain    Diabetes mellitus    on oral medication - no insulin   Enlarged prostate    GERD (gastroesophageal reflux disease)    Hypertension    Ventral hernia    Wrist injury    left- unsure how it occurred     Past Surgical History:  Procedure Laterality Date   APPENDECTOMY     ERCP N/A 01/29/2014   Procedure: ENDOSCOPIC RETROGRADE CHOLANGIOPANCREATOGRAPHY (ERCP);  Surgeon: Theda Belfast, MD;  Location: [facility];  Service: Endoscopy;  Laterality: N/A;   EUS N/A 01/01/2014   Procedure:  UPPER ENDOSCOPIC ULTRASOUND (EUS) LINEAR;  Surgeon: Theda Belfast, MD;  Location: [facility];  Service: Endoscopy;  Laterality: N/A;   HERNIA REPAIR  1980   hiatal hernia   INSERTION OF MESH N/A 09/28/2015   Procedure: INSERTION OF MESH;  Surgeon: Manus Rudd, MD;  Location: [facility];  Service: General;  Laterality: N/A;   LUMBAR LAMINECTOMY/DECOMPRESSION MICRODISCECTOMY N/A 04/24/2013   Procedure: CENTRAL DECOMPRESSIVE LUMBAR LAMINECTOMY L3-4 EXCISION 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 ;  Surgeon: Jacki Cones, MD;  Location: [facility];  Service: Orthopedics;  Laterality: N/A;   PANCREATIC STENT PLACEMENT N/A 01/29/2014   Procedure: PANCREATIC STENT PLACEMENT;  Surgeon: Theda Belfast, MD;  Location: [facility];  Service: Endoscopy;  Laterality: N/A;   ROTATOR CUFF REPAIR Right    UMBILICAL HERNIA REPAIR N/A 03/04/2014   Procedure: HERNIA REPAIR UMBILICAL ADULT;  Surgeon: Almond Lint, MD;  Location: [facility];  Service: General;  Laterality: N/A;   VENTRAL HERNIA REPAIR N/A 09/28/2015   Procedure:  VENTRAL INCISIONAL HERNIA REPAIR;  Surgeon: Manus Rudd, MD;  Location: [facility];  Service: General;  Laterality: N/A;   WHIPPLE PROCEDURE N/A 03/04/2014   Procedure:  DIAGNOSTIC LAPAROSCOPY, PANCREATICODUODENECTOMY, REMOVAL OF BILIARY STENT, PLACEMENT OF PANCREATIC DUCT STENT;  Surgeon: Almond Lint, MD;  Location: [facility];  Service: General;  Laterality: N/A;  Family Psychiatric History:   Family History:  Family History  Problem Relation Age of Onset   Diabetes Father     Social History:   Social History   Socioeconomic History   Marital status: Married    Spouse name: Not on file   Number of children: Not on file   Years of education: Not on file   Highest education level: Not on file  Occupational History   Not on file  Tobacco Use   Smoking status: Never   Smokeless tobacco: Never  Substance and Sexual Activity   Alcohol use: No   Drug use: Yes    Comment: marijuana   3 x's /weeks  ago- last time- April 2017   Sexual activity: Not on file  Other Topics Concern   Not on file  Social History Narrative   Not on file   Social Determinants of Health   Financial Resource Strain: Low Risk  (06/11/2022)   Received from [facility], [facility]   Overall Financial Resource Strain (CARDIA)    Difficulty of Paying Living Expenses: Not very hard  Food Insecurity: No Food Insecurity (06/11/2022)   Received from [facility], [facility]   Hunger Vital Sign    Worried About Running Out of Food in the Last Year: Never true    Ran Out of Food in the Last Year: Never true  Transportation Needs: No Transportation Needs (06/11/2022)   Received from [facility], [facility]   PRAPARE - Transportation    Lack of Transportation (Medical): No    Lack of Transportation (Non-Medical): No  Physical Activity: Unknown (02/19/2022)   Received from [facility], [facility]   Exercise Vital Sign    Days of Exercise per Week: 0 days    Minutes of Exercise per Session: Not on file  Stress: No Stress Concern Present (02/19/2022)   Received from [facility], [facility] - Occupational Stress Questionnaire    Feeling of Stress : Not at all  Social Connections: Socially Integrated (02/19/2022)   Received from [facility], [facility]   Social Network    How would you rate your social network (family, work, friends)?: Good participation with social networks    Additional Social History:   Allergies:   Allergies  Allergen Reactions   Morphine And Codeine     hallucinations    Aspirin Nausea Only and Other (See Comments)    Pt. Feels the mouth is watery & nausea     Metabolic Disorder Labs: Lab Results  Component Value Date   HGBA1C 7.0 (H) 09/20/2015   MPG 154 09/20/2015   No results found for: "PROLACTIN" No results found for: "CHOL", "TRIG", "HDL", "CHOLHDL", "VLDL", "LDLCALC" No results found for:  "TSH"  Therapeutic Level Labs: No results found for: "LITHIUM" No results found for: "CBMZ" No results found for: "VALPROATE"  Current Medications: Current Outpatient Medications  Medication Sig Dispense Refill   acetaminophen (TYLENOL) 500 MG tablet Take 1,000 mg by mouth daily as needed for mild pain.     CREON 36000 units CPEP capsule Take 72,000 mg by mouth 3 (three) times daily with meals.   11   esomeprazole (NEXIUM) 40 MG capsule Take 1 capsule (40 mg total) by mouth daily at 12 noon. 30 capsule 0   feeding supplement, GLUCERNA SHAKE, (GLUCERNA SHAKE) LIQD Take 237 mLs by mouth 3 (three) times daily between meals. (Patient taking differently: Take 237 mLs by mouth 2 (two)  times daily between meals.) 90 Can 0   ferrous fumarate (HEMOCYTE - 106 MG FE) 325 (106 FE) MG TABS tablet Take 1 tablet by mouth daily.     finasteride (PROSCAR) 5 MG tablet Take 5 mg by mouth daily.     glipiZIDE (GLUCOTROL XL) 10 MG 24 hr tablet Take 10 mg by mouth daily with breakfast.      lisinopril-hydrochlorothiazide (PRINZIDE,ZESTORETIC) 20-12.5 MG per tablet Take 1 tablet by mouth every morning.     metFORMIN (GLUCOPHAGE-XR) 500 MG 24 hr tablet Take 1,000 mg by mouth 2 (two) times daily.      Multiple Vitamin (MULTIVITAMIN WITH MINERALS) TABS tablet Take 1 tablet by mouth daily.     oxyCODONE-acetaminophen (PERCOCET/ROXICET) 5-325 MG tablet Take 1-2 tablets by mouth every 4 (four) hours as needed for moderate pain. 30 tablet 0   oxyCODONE-acetaminophen (PERCOCET/ROXICET) 5-325 MG tablet Take 1 tablet by mouth every 4 (four) hours as needed for severe pain. 40 tablet 0   tolterodine (DETROL) 2 MG tablet Take 2 mg by mouth 2 (two) times daily.   11   clonazePAM (KLONOPIN) 0.5 MG tablet 1  qam  2  qhs 90 tablet 4   traZODone (DESYREL) 100 MG tablet 2qhs for 1 wek may increse to 3 qhs 90 tablet 5   No current facility-administered medications for this visit.    Musculoskeletal: Strength & Muscle Tone:  within normal limits Gait & Station: normal Patient leans: N/A  Psychiatric Specialty Exam: Review of Systems  Blood pressure 127/73, pulse (!) 15, height 6\' 1"  (1.854 m), weight 170 lb (77.1 kg).Body mass index is 22.43 kg/m.  General Appearance: Casual  Eye Contact:  Good  Speech:  Clear and Coherent  Volume:  Normal  Mood:  Anxious  Affect:  NA  Thought Process:  Coherent  Orientation:  Full (Time, Place, and Person)  Thought Content:  WDL  Suicidal Thoughts:  No  Homicidal Thoughts:  No  Memory:  NA  Judgement:  NA  Insight:  NA  Psychomotor Activity:  Normal  Concentration:    Recall:  Good  Fund of Knowledge:Good  Language: Good  Akathisia:  No  Handed:  Right  AIMS (if indicated):  not done  Assets:  Desire for Improvement  ADL's:  Intact  Cognition: WNL  Sleep:  Good   Screenings:   Assessment and Plan:    This patient's diagnosis is generalized anxiety disorder.  He will continue taking Klonopin 1 mg in the morning and 2 at night.  Today we will increase his trazodone to taking 200 mg at night and if that does not work after a week he can increase it to 300 mg.  He will return to see me in 3 months.  I think overall the patient is reasonably stable.  He is just having trouble sleeping.   Gypsy Balsam, MD 9/17/20242:39 PM

## 2023-03-18 ENCOUNTER — Other Ambulatory Visit (HOSPITAL_COMMUNITY): Payer: Self-pay | Admitting: Gastroenterology

## 2023-03-18 DIAGNOSIS — R1084 Generalized abdominal pain: Secondary | ICD-10-CM

## 2023-04-01 ENCOUNTER — Ambulatory Visit (HOSPITAL_COMMUNITY)
Admission: RE | Admit: 2023-04-01 | Discharge: 2023-04-01 | Disposition: A | Discharge status: Home / Self Care | Payer: Medicare Other | Source: Ambulatory Visit | Attending: Gastroenterology | Admitting: Gastroenterology

## 2023-04-01 DIAGNOSIS — R1084 Generalized abdominal pain: Secondary | ICD-10-CM | POA: Insufficient documentation

## 2023-04-01 MED ORDER — IOHEXOL 350 MG/ML SOLN
75.0000 mL | Freq: Once | INTRAVENOUS | Status: AC | PRN
  Administered 2023-04-01: 75 mL via INTRAVENOUS

## 2023-05-21 ENCOUNTER — Other Ambulatory Visit: Payer: Self-pay

## 2023-05-21 ENCOUNTER — Ambulatory Visit (HOSPITAL_BASED_OUTPATIENT_CLINIC_OR_DEPARTMENT_OTHER): Payer: Medicare Other | Admitting: Psychiatry

## 2023-05-21 ENCOUNTER — Encounter (HOSPITAL_COMMUNITY): Payer: Self-pay | Admitting: Psychiatry

## 2023-05-21 VITALS — BP 128/77 | HR 83 | Ht 73.0 in | Wt 169.0 lb

## 2023-05-21 DIAGNOSIS — F411 Generalized anxiety disorder: Secondary | ICD-10-CM

## 2023-05-21 MED ORDER — CLONAZEPAM 0.5 MG PO TABS: ORAL_TABLET | ORAL | 4 refills | Status: DC

## 2023-05-21 MED ORDER — TRAZODONE HCL 100 MG PO TABS: ORAL_TABLET | ORAL | 5 refills | Status: DC

## 2023-05-21 NOTE — Progress Notes (Signed)
 .  Has found subcu psychiatric Initial Adult Assessment   Patient Identification: Peter Summers MRN:  [id number] Date of Evaluation:  05/21/2023 Referral Source:[facility] Chief Complaint:   Visit Diagnosis: Adjustment disorder with an anxious mood   Today the patient is only doing fairly well.  He shares with me that his wife has cancer that is distributed.  He describes it as terminal.  She had chemotherapy and radiation therapy and she is not doing well.  He still tries to go to work but he has to spend a lot of time trying to take care of her.  He denies being depressed.  His anxiety is somewhat worse.  Once again the patient denies any alcohol or drug use.  He has daughters are very supportive to him and to his wife.  He has been married to this woman for over 30 years.  Associated Signs/Symptoms: Depression Symptoms:  insomnia, (Hypo) Manic Symptoms:   Anxiety Symptoms:  Excessive Worry, Psychotic Symptoms:   PTSD Symptoms:   Past Psychiatric History:   Previous Psychotropic Medications yes  Substance Abuse History in the last 12 months:  none Consequences of Substance Abuse:   Past Medical History:  Past Medical History:  Diagnosis Date   Anxiety    Arthritis    ? R shoulder  & L shoulder    Cancer (HCC)    pancreatic cancer-abdominal pain    Diabetes mellitus    on oral medication - no insulin    Enlarged prostate    GERD (gastroesophageal reflux disease)    Hypertension    Ventral hernia    Wrist injury    left- unsure how it occurred     Past Surgical History:  Procedure Laterality Date   APPENDECTOMY     ERCP N/A 01/29/2014   Procedure: ENDOSCOPIC RETROGRADE CHOLANGIOPANCREATOGRAPHY (ERCP);  Surgeon: Belvie JONETTA Just, MD;  Location: [facility];  Service: Endoscopy;  Laterality: N/A;   EUS N/A 01/01/2014   Procedure: UPPER ENDOSCOPIC ULTRASOUND (EUS) LINEAR;  Surgeon: Belvie JONETTA Just, MD;  Location: [facility];  Service: Endoscopy;  Laterality: N/A;   HERNIA  REPAIR  1980   hiatal hernia   INSERTION OF MESH N/A 09/28/2015   Procedure: INSERTION OF MESH;  Surgeon: Donnice Lima, MD;  [facility];  Service: General;  Laterality: N/A;   LUMBAR LAMINECTOMY/DECOMPRESSION MICRODISCECTOMY N/A 04/24/2013   Procedure: CENTRAL DECOMPRESSIVE LUMBAR LAMINECTOMY L3-4 EXCISION 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 ;  Surgeon: Tanda DELENA Heading, MD;  Location: [facility];  Service: Orthopedics;  Laterality: N/A;   PANCREATIC STENT PLACEMENT N/A 01/29/2014   Procedure: PANCREATIC STENT PLACEMENT;  Surgeon: Belvie JONETTA Just, MD;  Location: [facility];  Service: Endoscopy;  Laterality: N/A;   ROTATOR CUFF REPAIR Right    UMBILICAL HERNIA REPAIR N/A 03/04/2014   Procedure: HERNIA REPAIR UMBILICAL ADULT;  Surgeon: Jina Nephew, MD;  Location: [facility];  Service: General;  Laterality: N/A;   VENTRAL HERNIA REPAIR N/A 09/28/2015   Procedure:  VENTRAL INCISIONAL HERNIA REPAIR;  Surgeon: Donnice Lima, MD;  [facility];  Service: General;  Laterality: N/A;   WHIPPLE PROCEDURE N/A 03/04/2014   Procedure:  DIAGNOSTIC LAPAROSCOPY, PANCREATICODUODENECTOMY, REMOVAL OF BILIARY STENT, PLACEMENT OF PANCREATIC DUCT STENT;  Surgeon: Jina Nephew, MD;  Location: [facility];  Service: General;  Laterality: N/A;    Family Psychiatric History:   Family History:  Family History  Problem Relation Age of Onset   Diabetes Father     Social History:   Social  History   Socioeconomic History   Marital status: Married    Spouse name: Not on file   Number of children: Not on file   Years of education: Not on file   Highest education level: Not on file  Occupational History   Not on file  Tobacco Use   Smoking status: Never   Smokeless tobacco: Never  Substance and Sexual Activity   Alcohol use: No   Drug use: Yes    Comment: marijuana   3 x's /weeks ago- last time- April 2017   Sexual activity: Not on file  Other Topics Concern   Not on file  Social History Narrative   Not on file    Social Drivers of Health   Financial Resource Strain: Low Risk  (03/11/2023)   Received from [facility]   Overall Financial Resource Strain (CARDIA)    Difficulty of Paying Living Expenses: Not hard at all  Food Insecurity: No Food Insecurity (03/11/2023)   Received from [facility]   Hunger Vital Sign    Worried About Running Out of Food in the Last Year: Never true    Ran Out of Food in the Last Year: Never true  Transportation Needs: No Transportation Needs (03/11/2023)   Received from [facility] - Transportation    Lack of Transportation (Medical): No    Lack of Transportation (Non-Medical): No  Physical Activity: Insufficiently Active (03/11/2023)   Received from [facility]   Exercise Vital Sign    Days of Exercise per Week: 1 day    Minutes of Exercise per Session: 10 min  Stress: No Stress Concern Present (03/11/2023)   Received from [facility] - Occupational Stress Questionnaire    Feeling of Stress : Not at all  Social Connections: Moderately Integrated (03/11/2023)   Received from [facility]   Social Network    How would you rate your social network (family, work, friends)?: Adequate participation with social networks    Additional Social History:   Allergies:   Allergies  Allergen Reactions   Morphine  And Codeine     hallucinations    Aspirin Nausea Only and Other (See Comments)    Pt. Feels the mouth is watery & nausea     Metabolic Disorder Labs: Lab Results  Component Value Date   HGBA1C 7.0 (H) 09/20/2015   MPG 154 09/20/2015   No results found for: PROLACTIN No results found for: CHOL, TRIG, HDL, CHOLHDL, VLDL, LDLCALC No results found for: TSH  Therapeutic Level Labs: No results found for: LITHIUM No results found for: CBMZ No results found for: VALPROATE  Current Medications: Current Outpatient Medications  Medication Sig Dispense Refill    acetaminophen  (TYLENOL ) 500 MG tablet Take 1,000 mg by mouth daily as needed for mild pain.     clonazePAM  (KLONOPIN ) 0.5 MG tablet 1  qam  2  qhs 90 tablet 4   CREON  36000 units CPEP capsule Take 72,000 mg by mouth 3 (three) times daily with meals.   11   esomeprazole  (NEXIUM ) 40 MG capsule Take 1 capsule (40 mg total) by mouth daily at 12 noon. 30 capsule 0   feeding supplement, GLUCERNA SHAKE, (GLUCERNA SHAKE) LIQD Take 237 mLs by mouth 3 (three) times daily between meals. (Patient taking differently: Take 237 mLs by mouth 2 (two) times daily between meals.) 90 Can 0   ferrous fumarate  (HEMOCYTE - 106 MG FE) 325 (106 FE) MG TABS tablet Take  1 tablet by mouth daily.     finasteride  (PROSCAR ) 5 MG tablet Take 5 mg by mouth daily.     glipiZIDE  (GLUCOTROL  XL) 10 MG 24 hr tablet Take 10 mg by mouth daily with breakfast.      lisinopril -hydrochlorothiazide  (PRINZIDE ,ZESTORETIC ) 20-12.5 MG per tablet Take 1 tablet by mouth every morning.     metFORMIN  (GLUCOPHAGE -XR) 500 MG 24 hr tablet Take 1,000 mg by mouth 2 (two) times daily.      Multiple Vitamin (MULTIVITAMIN WITH MINERALS) TABS tablet Take 1 tablet by mouth daily.     oxyCODONE -acetaminophen  (PERCOCET/ROXICET) 5-325 MG tablet Take 1-2 tablets by mouth every 4 (four) hours as needed for moderate pain. 30 tablet 0   oxyCODONE -acetaminophen  (PERCOCET/ROXICET) 5-325 MG tablet Take 1 tablet by mouth every 4 (four) hours as needed for severe pain. 40 tablet 0   tolterodine (DETROL) 2 MG tablet Take 2 mg by mouth 2 (two) times daily.   11   traZODone  (DESYREL ) 100 MG tablet 2qhs for 1 wek may increse to 3 qhs 90 tablet 5   No current facility-administered medications for this visit.    Musculoskeletal: Strength & Muscle Tone: within normal limits Gait & Station: normal Patient leans: N/A  Psychiatric Specialty Exam: Review of Systems  Blood pressure 128/77, pulse 83, height 6' 1 (1.854 m), weight 169 lb (76.7 kg).Body mass index is 22.3  kg/m.  General Appearance: Casual  Eye Contact:  Good  Speech:  Clear and Coherent  Volume:  Normal  Mood:  Anxious  Affect:  NA  Thought Process:  Coherent  Orientation:  Full (Time, Place, and Person)  Thought Content:  WDL  Suicidal Thoughts:  No  Homicidal Thoughts:  No  Memory:  NA  Judgement:  NA  Insight:  NA  Psychomotor Activity:  Normal  Concentration:    Recall:  Good  Fund of Knowledge:Good  Language: Good  Akathisia:  No  Handed:  Right  AIMS (if indicated):  not done  Assets:  Desire for Improvement  ADL's:  Intact  Cognition: WNL  Sleep:  Good   Screenings:   Assessment and Plan:   This patient's diagnosis is generalized anxiety disorder.  At this time he also is experiencing significant anxiety related to his wife's illness.  For now we will continue his Klonopin  0.5 mg 1 in the morning and 2 at night.  I think this is a very appropriate dose for this patient.  He also takes trazodone  and since we increased it from 100 mg to 200 mg he is sleeping better.  This patient will be seen again in 3 months.   Elna LILLETTE Lo, MD 1/14/20254:55 PM

## 2023-08-27 ENCOUNTER — Other Ambulatory Visit: Payer: Self-pay

## 2023-08-27 ENCOUNTER — Ambulatory Visit (HOSPITAL_COMMUNITY): Payer: Medicare Other | Admitting: Psychiatry

## 2023-08-27 VITALS — BP 157/72 | HR 76 | Ht 73.0 in | Wt 167.0 lb

## 2023-08-27 DIAGNOSIS — F411 Generalized anxiety disorder: Secondary | ICD-10-CM

## 2023-08-27 MED ORDER — TRAZODONE HCL 100 MG PO TABS: ORAL_TABLET | ORAL | 5 refills | Status: DC

## 2023-08-27 MED ORDER — CLONAZEPAM 0.5 MG PO TABS: ORAL_TABLET | ORAL | 4 refills | Status: DC

## 2023-08-27 NOTE — Progress Notes (Signed)
 .  Has found subcu psychiatric Initial Adult Assessment   Patient Identification: Peter Summers MRN:  [id number] Date of Evaluation:  08/27/2023 Referral Source:[facility] Chief Complaint:   Visit Diagnosis: Adjustment disorder with an anxious mood    Today the patient seems to be at his baseline.  He still works 4 days a week.  But most significantly is the fact that his wife is receiving chemotherapy and radiation therapy for what sounds like an extensive cancer.  The patient cannot describe where the cancer originated from.  He says she is nearly homebound all the time.  His daughter is helping take care of her.  The patient himself is sleeping and eating well.  He does not use any alcohol or drugs.  He has no evidence of psychosis.  Today's blood pressure was elevated and he was concerned.  The patient denies chest pain or shortness of breath.  His anxiety is reasonably well controlled with a relatively low dose of Klonopin .  Associated Signs/Symptoms: Depression Symptoms:  insomnia, (Hypo) Manic Symptoms:   Anxiety Symptoms:  Excessive Worry, Psychotic Symptoms:   PTSD Symptoms:   Past Psychiatric History:   Previous Psychotropic Medications yes  Substance Abuse History in the last 12 months:  none Consequences of Substance Abuse:   Past Medical History:  Past Medical History:  Diagnosis Date   Anxiety    Arthritis    ? R shoulder  & L shoulder    Cancer (HCC)    pancreatic cancer-abdominal pain    Diabetes mellitus    on oral medication - no insulin    Enlarged prostate    GERD (gastroesophageal reflux disease)    Hypertension    Ventral hernia    Wrist injury    left- unsure how it occurred     Past Surgical History:  Procedure Laterality Date   APPENDECTOMY     ERCP N/A 01/29/2014   Procedure: ENDOSCOPIC RETROGRADE CHOLANGIOPANCREATOGRAPHY (ERCP);  Surgeon: Almeda Aris, MD;  Location: [facility];  Service: Endoscopy;  Laterality: N/A;   EUS N/A 01/01/2014    Procedure: UPPER ENDOSCOPIC ULTRASOUND (EUS) LINEAR;  Surgeon: Almeda Aris, MD;  [facility];  Service: Endoscopy;  Laterality: N/A;   HERNIA REPAIR  1980   hiatal hernia   INSERTION OF MESH N/A 09/28/2015   Procedure: INSERTION OF MESH;  Surgeon: Dareen Ebbing, MD;  Location[facility];  Service: General;  Laterality: N/A;   LUMBAR LAMINECTOMY/DECOMPRESSION MICRODISCECTOMY N/A 04/24/2013   Procedure: CENTRAL DECOMPRESSIVE LUMBAR LAMINECTOMY L3-4 EXCISION 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 ;  Surgeon: Florencia Hunter, MD;  [facility]: [facility];  Service: Orthopedics;  Laterality: N/A;   PANCREATIC STENT PLACEMENT N/A 01/29/2014   Procedure: PANCREATIC STENT PLACEMENT;  Surgeon: Almeda Aris, MD;  [facility]: [facility];  Service: Endoscopy;  Laterality: N/A;   ROTATOR CUFF REPAIR Right    UMBILICAL HERNIA REPAIR N/A 03/04/2014   Procedure: HERNIA REPAIR UMBILICAL ADULT;  Surgeon: Lockie Rima, MD;  [facility]: [facility];  Service: General;  Laterality: N/A;   VENTRAL HERNIA REPAIR N/A 09/28/2015   Procedure:  VENTRAL INCISIONAL HERNIA REPAIR;  Surgeon: Dareen Ebbing, MD;  Location: [facility];  Service: General;  Laterality: N/A;   WHIPPLE PROCEDURE N/A 03/04/2014   Procedure:  DIAGNOSTIC LAPAROSCOPY, PANCREATICODUODENECTOMY, REMOVAL OF BILIARY STENT, PLACEMENT OF PANCREATIC DUCT STENT;  Surgeon: Lockie Rima, MD;  Location: [facility];  Service: General;  Laterality: N/A;    Family Psychiatric History:   Family History:  Family History  Problem Relation Age of Onset   Diabetes Father     Social History:   Social History   Socioeconomic History   Marital status: Married    Spouse name: Not on file   Number of children: Not on file   Years of education: Not on file   Highest education level: Not on file  Occupational History   Not on file  Tobacco Use   Smoking status: Never   Smokeless tobacco: Never  Substance and Sexual Activity   Alcohol use: No   Drug use: Yes    Comment: marijuana   3  x's /weeks ago- last time- April 2017   Sexual activity: Not on file  Other Topics Concern   Not on file  Social History Narrative   Not on file   Social Drivers of Health   Financial Resource Strain: Low Risk  (07/03/2023)   Received [facility]   Overall Financial Resource Strain (CARDIA)    Difficulty of Paying Living Expenses: Not hard at all  Food Insecurity: No Food Insecurity (07/03/2023)   Received [facility]   Hunger Vital Sign    Worried About Running Out of Food in the Last Year: Never true    Ran Out of Food in the Last Year: Never true  Transportation Needs: No Transportation Needs (07/03/2023)   Received from [facility] - Transportation    Lack of Transportation (Medical): No    Lack of Transportation (Non-Medical): No  Physical Activity: Insufficiently Active (03/11/2023)   Received from [facility]   Exercise Vital Sign    Days of Exercise per Week: 1 day    Minutes of Exercise per Session: 10 min  Stress: No Stress Concern Present (03/11/2023)   Received from [facility] - Occupational Stress Questionnaire    Feeling of Stress : Not at all  Social Connections: Moderately Integrated (03/11/2023)   Received from [facility]   Social Network    How would you rate your social network (family, work, friends)?: Adequate participation with social networks    Additional Social History:   Allergies:   Allergies  Allergen Reactions   Morphine  And Codeine     hallucinations    Aspirin Nausea Only and Other (See Comments)    Pt. Feels the mouth is watery & nausea     Metabolic Disorder Labs: Lab Results  Component Value Date   HGBA1C 7.0 (H) 09/20/2015   MPG 154 09/20/2015   No results found for: "PROLACTIN" No results found for: "CHOL", "TRIG", "HDL", "CHOLHDL", "VLDL", "LDLCALC" No results found for: "TSH"  Therapeutic Level Labs: No results found for: "LITHIUM" No results found  for: "CBMZ" No results found for: "VALPROATE"  Current Medications: Current Outpatient Medications  Medication Sig Dispense Refill   acetaminophen  (TYLENOL ) 500 MG tablet Take 1,000 mg by mouth daily as needed for mild pain.     CREON  36000 units CPEP capsule Take 72,000 mg by mouth 3 (three) times daily with meals.   11   esomeprazole  (NEXIUM ) 40 MG capsule Take 1 capsule (40 mg total) by mouth daily at 12 noon. 30 capsule 0   ferrous fumarate  (HEMOCYTE - 106 MG FE) 325 (106 FE) MG TABS tablet Take 1 tablet by mouth daily.     finasteride  (PROSCAR ) 5 MG tablet Take 5 mg by mouth daily.     glipiZIDE  (GLUCOTROL  XL) 10 MG 24 hr tablet Take 10 mg by  mouth daily with breakfast.      metFORMIN  (GLUCOPHAGE -XR) 500 MG 24 hr tablet Take 1,000 mg by mouth 2 (two) times daily.      Multiple Vitamin (MULTIVITAMIN WITH MINERALS) TABS tablet Take 1 tablet by mouth daily.     oxyCODONE -acetaminophen  (PERCOCET/ROXICET) 5-325 MG tablet Take 1-2 tablets by mouth every 4 (four) hours as needed for moderate pain. 30 tablet 0   oxyCODONE -acetaminophen  (PERCOCET/ROXICET) 5-325 MG tablet Take 1 tablet by mouth every 4 (four) hours as needed for severe pain. 40 tablet 0   clonazePAM  (KLONOPIN ) 0.5 MG tablet 1  qam  2  qhs 90 tablet 4   feeding supplement, GLUCERNA SHAKE, (GLUCERNA SHAKE) LIQD Take 237 mLs by mouth 3 (three) times daily between meals. (Patient not taking: Reported on 08/27/2023) 90 Can 0   lisinopril -hydrochlorothiazide  (PRINZIDE ,ZESTORETIC ) 20-12.5 MG per tablet Take 1 tablet by mouth every morning. (Patient not taking: Reported on 08/27/2023)     tolterodine (DETROL) 2 MG tablet Take 2 mg by mouth 2 (two) times daily.  (Patient not taking: Reported on 08/27/2023)  11   traZODone  (DESYREL ) 100 MG tablet 2qhs for 1 wek may increse to 3 qhs 90 tablet 5   No current facility-administered medications for this visit.    Musculoskeletal: Strength & Muscle Tone: within normal limits Gait & Station:  normal Patient leans: N/A  Psychiatric Specialty Exam: Review of Systems  Blood pressure (!) 157/72, pulse 76, height 6\' 1"  (1.854 m), weight 167 lb (75.8 kg).Body mass index is 22.03 kg/m.  General Appearance: Casual  Eye Contact:  Good  Speech:  Clear and Coherent  Volume:  Normal  Mood:  Anxious  Affect:  NA  Thought Process:  Coherent  Orientation:  Full (Time, Place, and Person)  Thought Content:  WDL  Suicidal Thoughts:  No  Homicidal Thoughts:  No  Memory:  NA  Judgement:  NA  Insight:  NA  Psychomotor Activity:  Normal  Concentration:    Recall:  Good  Fund of Knowledge:Good  Language: Good  Akathisia:  No  Handed:  Right  AIMS (if indicated):  not done  Assets:  Desire for Improvement  ADL's:  Intact  Cognition: WNL  Sleep:  Good   Screenings:   Assessment and Plan:  This patient's diagnosis is generalized anxiety disorder.  The patient will continue taking Klonopin  0.5 mg 1 in the morning and 2 at night.  His second problem is insomnia.  Will continue taking 200 mg of trazodone  which seems to work well.  Patient will return to see me in 3 months.   Delorse Fey, MD 4/22/20254:47 PM

## 2023-08-28 ENCOUNTER — Ambulatory Visit (HOSPITAL_COMMUNITY): Payer: Medicare Other | Admitting: Psychiatry

## 2023-08-29 ENCOUNTER — Encounter (HOSPITAL_COMMUNITY): Payer: Self-pay | Admitting: Emergency Medicine

## 2023-08-29 ENCOUNTER — Other Ambulatory Visit: Payer: Self-pay

## 2023-08-29 ENCOUNTER — Emergency Department (HOSPITAL_COMMUNITY)
Admission: EM | Admit: 2023-08-29 | Discharge: 2023-08-30 | Disposition: A | Discharge status: Home / Self Care | Attending: Emergency Medicine | Admitting: Emergency Medicine

## 2023-08-29 DIAGNOSIS — Z8507 Personal history of malignant neoplasm of pancreas: Secondary | ICD-10-CM | POA: Diagnosis not present

## 2023-08-29 DIAGNOSIS — R519 Headache, unspecified: Secondary | ICD-10-CM | POA: Insufficient documentation

## 2023-08-29 DIAGNOSIS — Z7984 Long term (current) use of oral hypoglycemic drugs: Secondary | ICD-10-CM | POA: Insufficient documentation

## 2023-08-29 DIAGNOSIS — R42 Dizziness and giddiness: Secondary | ICD-10-CM | POA: Diagnosis present

## 2023-08-29 DIAGNOSIS — E109 Type 1 diabetes mellitus without complications: Secondary | ICD-10-CM | POA: Diagnosis not present

## 2023-08-29 DIAGNOSIS — I10 Essential (primary) hypertension: Secondary | ICD-10-CM | POA: Diagnosis not present

## 2023-08-29 LAB — CBC
HCT: 36.6 % — ABNORMAL LOW (ref 39.0–52.0)
Hemoglobin: 11.7 g/dL — ABNORMAL LOW (ref 13.0–17.0)
MCH: 27.9 pg (ref 26.0–34.0)
MCHC: 32 g/dL (ref 30.0–36.0)
MCV: 87.1 fL (ref 80.0–100.0)
Platelets: 235 10*3/uL (ref 150–400)
RBC: 4.2 MIL/uL — ABNORMAL LOW (ref 4.22–5.81)
RDW: 15.5 % (ref 11.5–15.5)
WBC: 6.9 10*3/uL (ref 4.0–10.5)
nRBC: 0 % (ref 0.0–0.2)

## 2023-08-29 LAB — BASIC METABOLIC PANEL WITH GFR
Anion gap: 12 (ref 5–15)
BUN: 13 mg/dL (ref 8–23)
CO2: 24 mmol/L (ref 22–32)
Calcium: 9.4 mg/dL (ref 8.9–10.3)
Chloride: 103 mmol/L (ref 98–111)
Creatinine, Ser: 0.99 mg/dL (ref 0.61–1.24)
GFR, Estimated: >60 mL/min (ref >60)
Glucose, Bld: 99 mg/dL (ref 70–99)
Potassium: 4.3 mmol/L (ref 3.5–5.1)
Sodium: 139 mmol/L (ref 135–145)

## 2023-08-29 LAB — URINALYSIS, ROUTINE W REFLEX MICROSCOPIC
Bacteria, UA: NONE SEEN
Bilirubin Urine: NEGATIVE
Glucose, UA: >=500 mg/dL — AB
Hgb urine dipstick: NEGATIVE
Ketones, ur: NEGATIVE mg/dL
Nitrite: NEGATIVE
Protein, ur: NEGATIVE mg/dL
Specific Gravity, Urine: 1.022 (ref 1.005–1.030)
pH: 5 (ref 5.0–8.0)

## 2023-08-29 LAB — CBG MONITORING, ED: Glucose-Capillary: 93 mg/dL (ref 70–99)

## 2023-08-29 NOTE — ED Triage Notes (Signed)
 Pt reports feeling lightheaded and weak X4 days.  He states that while he has been taking his diabetes medication his sugars have been high (checks them 2X daily). He also states his blood pressure has been high since being removed from hs blood pressure medication.  NIH is negative.

## 2023-08-30 ENCOUNTER — Emergency Department (HOSPITAL_COMMUNITY)

## 2023-08-30 LAB — CBG MONITORING, ED: Glucose-Capillary: 163 mg/dL — ABNORMAL HIGH (ref 70–99)

## 2023-08-30 MED ORDER — PROCHLORPERAZINE EDISYLATE 10 MG/2ML IJ SOLN
5.0000 mg | Freq: Once | INTRAMUSCULAR | Status: DC
  Filled 2023-08-30: qty 2

## 2023-08-30 MED ORDER — PROCHLORPERAZINE MALEATE 5 MG PO TABS
5.0000 mg | ORAL_TABLET | Freq: Once | ORAL | Status: AC
  Administered 2023-08-30: 5 mg via ORAL
  Filled 2023-08-30: qty 1

## 2023-08-30 NOTE — ED Provider Notes (Signed)
 [place] [facility] DEPARTMENT AT [facility] Provider Note  CSN: [id number] Arrival date & time: 08/29/23 6962  Chief Complaint(s) Dizziness and Hypertension  HPI Peter Summers is a 83 y.o. male with a past medical history listed below including pancreatic cancer status post Whipple, insulin -dependent diabetes here for 4 days of dizziness and feeling off balance.  Patient reports that he notices this more when he is moving.  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  Feels like he is being pulled to 1 side when walking.  He also endorses frontal headache.  No nausea or vomiting.  No diarrhea.  No chest pain or shortness of breath.  No focal deficits.  No visual disturbance.  No other physical complaints.  Currently does not feel dizzy.  When I stood him up in the room and had a walking, he denied any dizziness.  The history is provided by the patient.    Past Medical History Past Medical History:  Diagnosis Date   Anxiety    Arthritis    ? 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 Authorizing Provider  acetaminophen  (TYLENOL ) 500 MG tablet Take 1,000 mg by mouth daily as needed for mild pain.    [provider]  clonazePAM  (KLONOPIN ) 0.5 MG  tablet 1  qam  2  qhs 08/27/23   Plovsky, Doroteo Gasmen, MD  CREON  36000 units CPEP capsule Take 72,000 mg by mouth 3 (three) times daily with meals.  08/16/15   [provider]  esomeprazole  (NEXIUM ) 40 MG capsule Take 1 capsule (40 mg total) by mouth daily at 12 noon. 03/12/14   Lockie Rima, MD  feeding supplement, GLUCERNA SHAKE, (GLUCERNA SHAKE) LIQD Take 237 mLs by mouth 3 (three) times daily between meals. Patient not taking: Reported on 08/27/2023 03/12/14   Lockie Rima, MD  ferrous fumarate  (HEMOCYTE - 106 MG FE) 325 (106 FE) MG TABS tablet Take 1 tablet by mouth daily.    [provider]  finasteride  (PROSCAR ) 5 MG tablet Take 5 mg by mouth daily.    [provider]  glipiZIDE  (GLUCOTROL  XL) 10 MG 24 hr tablet Take 10 mg by mouth daily with breakfast.  09/12/15   [provider]  lisinopril -hydrochlorothiazide  (PRINZIDE ,ZESTORETIC ) 20-12.5 MG per tablet Take 1 tablet by mouth every morning. Patient not taking: Reported on 08/27/2023    [provider]  metFORMIN  (GLUCOPHAGE -XR) 500 MG 24 hr tablet Take 1,000 mg by mouth 2 (two) times daily.  07/14/15   [provider]  Multiple Vitamin (MULTIVITAMIN WITH MINERALS) TABS tablet Take 1 tablet by mouth daily.    [provider]  oxyCODONE -acetaminophen  (PERCOCET/ROXICET) 5-325 MG tablet Take 1-2  tablets by mouth every 4 (four) hours as needed for moderate pain. 09/29/15   Dareen Ebbing, MD  oxyCODONE -acetaminophen  (PERCOCET/ROXICET) 5-325 MG tablet Take 1 tablet by mouth every 4 (four) hours as needed for severe pain. 09/29/15   Dareen Ebbing, MD  tolterodine (DETROL) 2 MG tablet Take 2 mg by mouth 2 (two) times daily.  Patient not taking: Reported on 08/27/2023 08/20/15   [provider]  traZODone  (DESYREL ) 100 MG tablet 2qhs for 1 wek may increse to 3 qhs 08/27/23   Plovsky, Doroteo Gasmen, MD                                                                                                                                     Allergies Morphine  and codeine and Aspirin  Review of Systems Review of Systems As noted in HPI  Physical Exam Vital Signs  I have reviewed the triage vital signs BP (!) 143/73   Pulse 60   Temp 97.9 F (36.6 C)   Resp 16   Ht 6\' 1"  (1.854 m)   Wt 75.8 kg   SpO2 100%   BMI 22.05 kg/m   Physical Exam Vitals reviewed.  Constitutional:      General: He is not in acute distress.    Appearance: He is well-developed. He is not diaphoretic.  HENT:     Head: Normocephalic and atraumatic.     Nose: Nose normal.  Eyes:     General: No scleral icterus.       Right eye: No discharge.        Left eye: No discharge.     Conjunctiva/sclera: Conjunctivae normal.     Pupils: Pupils are equal, round, and reactive to light.  Cardiovascular:     Rate and Rhythm: Normal rate and regular rhythm.     Heart sounds: No murmur heard.    No friction rub. No gallop.  Pulmonary:     Effort: Pulmonary effort is normal. No respiratory distress.     Breath sounds: Normal breath sounds. No stridor. No rales.  Abdominal:     General: There is no distension.     Palpations: Abdomen is soft.     Tenderness: There is no abdominal tenderness.  Musculoskeletal:        General: No tenderness.     Cervical back: Normal range of motion and neck supple.  Skin:    General: Skin is warm and dry.     Findings: No erythema or rash.  Neurological:     Mental Status: He is alert and oriented to person, place, and time.     Comments: Mental Status:  Alert and oriented to person, place, and time.  Attention and concentration normal.  Speech clear.  Recent memory is intact  Cranial Nerves:  II Visual Fields: Intact to confrontation. Visual fields intact. III, IV, VI: Pupils equal and reactive to light and near. Full eye movement without  nystagmus  V Facial Sensation: Normal. No weakness of masticatory muscles  VII: No facial weakness or asymmetry  VIII Auditory Acuity:  Grossly normal  IX/X: The uvula is midline; the palate elevates symmetrically  XI: Normal sternocleidomastoid and trapezius strength  XII: The tongue is midline. No atrophy or fasciculations.   Motor System: Muscle Strength: 5/5 and symmetric in the upper and lower extremities. No pronation or drift.  Muscle Tone: Tone and muscle bulk are normal in the upper and lower extremities.  Coordination: Intact finger-to-nose. No tremor.  Sensation: Intact to light touch. Negative Romberg test.  Gait: Routine gait normal.      ED Results and Treatments Labs (all labs ordered are listed, but only abnormal results are displayed) Labs Reviewed  CBC - Abnormal; Notable for the following components:      Result Value   RBC 4.20 (*)    Hemoglobin 11.7 (*)    HCT 36.6 (*)    All other components within normal limits  URINALYSIS, ROUTINE W REFLEX MICROSCOPIC - Abnormal; Notable for the following components:   APPearance HAZY (*)    Glucose, UA >=500 (*)    Leukocytes,Ua TRACE (*)    All other components within normal limits  CBG MONITORING, ED - Abnormal; Notable for the following components:   Glucose-Capillary 163 (*)    All other components within normal limits  BASIC METABOLIC PANEL WITH GFR  CBG MONITORING, ED                                                                                                                         EKG  EKG Interpretation Date/Time:  Thursday August 29 2023 19:39:12 EDT Ventricular Rate:  73 PR Interval:  166 QRS Duration:  120 QT Interval:  390 QTC Calculation: 429 R Axis:   81  Text Interpretation: Normal sinus rhythm Right bundle branch block Abnormal ECG When compared with ECG of 20-Sep-2015 15:35, PREVIOUS ECG IS PRESENT Confirmed by Townsend Freud [phone]) on 08/30/2023 4:00:53 AM       Radiology CT Head Wo Contrast Result Date: 08/30/2023 CLINICAL DATA:  Headache, new onset.- EXAM: CT HEAD WITHOUT CONTRAST TECHNIQUE: Contiguous axial images  were obtained from the base of the skull through the vertex without intravenous contrast. RADIATION DOSE REDUCTION: This exam was performed according to the departmental dose-optimization program which includes automated exposure control, adjustment of the mA and/or kV according to patient size and/or use of iterative reconstruction technique. COMPARISON:  None Available. FINDINGS: Brain: No evidence of acute infarction, hemorrhage, hydrocephalus, extra-axial collection or mass lesion/mass effect. Generalized brain atrophy with ventriculomegaly. Mild periventricular low-density. Vascular: No hyperdense vessel or unexpected calcification. Skull: Normal. Negative for fracture or focal lesion. Sinuses/Orbits: No acute finding. IMPRESSION: No acute finding. Generalized atrophy. Electronically Signed   By: Ronnette Coke M.D.   On: 08/30/2023 04:58    Medications Ordered in ED Medications  prochlorperazine  (COMPAZINE ) tablet 5 mg (5 mg Oral Given 08/30/23 0503)  Procedures Procedures  (including critical care time) Medical Decision Making / ED Course   Medical Decision Making Amount and/or Complexity of Data Reviewed Labs: ordered. Decision-making details documented in ED Course. Radiology: ordered and independent interpretation performed. Decision-making details documented in ED Course. ECG/medicine tests: ordered and independent interpretation performed. Decision-making details documented in ED Course.  Risk Prescription drug management.    Intermittent dizziness. Differential diagnosis considered.  Workup below.  No focal deficits noted on exam concerning for CVA.  CT head negative for ICH or space-occupying lesion.  No evidence of remote infarcts.  EKG without acute ischemic changes or dysrhythmias or high-grade blocks. CBC without leukocytosis.  Mild anemia with stable hemoglobin. No electrolyte derangements or renal insufficiency. Blood sugar levels reassuring. UA without evidence  of infection.     Final Clinical Impression(s) / ED Diagnoses Final diagnoses:  Dizziness   The patient appears reasonably screened and/or stabilized for discharge and I doubt any other medical condition or other [facility] requiring further screening, evaluation, or treatment in the ED at this time. I have discussed the findings, Dx and Tx plan with the patient/family who expressed understanding and agree(s) with the plan. Discharge instructions discussed at length. The patient/family was given strict return precautions who verbalized understanding of the instructions. No further questions at time of discharge.  Disposition: Discharge  Condition: Good  ED Discharge Orders     None        Follow Up: Primary care provider  Call  to schedule an appointment for close follow up    This chart was dictated using voice recognition software.  Despite best efforts to proofread,  errors can occur which can change the documentation meaning.    Lindle Rhea, MD 08/30/23 [phone]

## 2023-08-30 NOTE — ED Notes (Signed)
 Pt to radiology.

## 2023-08-30 NOTE — ED Notes (Signed)
 Pt returned from CT

## 2023-11-26 ENCOUNTER — Encounter (HOSPITAL_COMMUNITY): Payer: Self-pay | Admitting: Psychiatry

## 2023-11-26 ENCOUNTER — Ambulatory Visit (HOSPITAL_COMMUNITY): Admitting: Psychiatry

## 2023-11-26 ENCOUNTER — Other Ambulatory Visit: Payer: Self-pay

## 2023-11-26 VITALS — BP 149/75 | HR 78 | Ht 73.0 in | Wt 162.0 lb

## 2023-11-26 DIAGNOSIS — F4323 Adjustment disorder with mixed anxiety and depressed mood: Secondary | ICD-10-CM | POA: Diagnosis not present

## 2023-11-26 MED ORDER — CLONAZEPAM 0.5 MG PO TABS: ORAL_TABLET | ORAL | 4 refills | Status: AC

## 2023-11-26 MED ORDER — TRAZODONE HCL 100 MG PO TABS: ORAL_TABLET | ORAL | 5 refills | Status: AC

## 2023-11-26 NOTE — Progress Notes (Signed)
 .  Has found subcu psychiatric Initial Adult Assessment   Patient Identification: Peter Summers MRN:  [id number] Date of Evaluation:  11/26/2023 Referral Source:[facility] Chief Complaint:   Visit Diagnosis: Adjustment disorder with an anxious mood    Today the patient is seen in the office.  The patient and his wife is only doing fairly well.  She is seen in the cancer Center across the street.  She received chemotherapy on a regular basis.  She is only doing fairly well.  She is having problems walking.  This is the patient's only stressor.  The patient is reduce his workday down to 4 days which is his way of cutting down.  His appetite is stable but he is sleeping poorly.  He feels anxious and nervous and describes what sounds like a tension headache.  The trazodone  is helping him sleep but still takes him over an hour to fall asleep.  The patient denies persistent depression.  He denies the use of alcohol or drugs.  His medical health is fairly stable.  He has lost a little bit of weight.  Associated Signs/Symptoms: Depression Symptoms:  insomnia, (Hypo) Manic Symptoms:   Anxiety Symptoms:  Excessive Worry, Psychotic Symptoms:   PTSD Symptoms:   Past Psychiatric History:   Previous Psychotropic Medications yes  Substance Abuse History in the last 12 months:  none Consequences of Substance Abuse:   Past Medical History:  Past Medical History:  Diagnosis Date   Anxiety    Arthritis    ? R shoulder  & L shoulder    Cancer (HCC)    pancreatic cancer-abdominal pain    Diabetes mellitus    on oral medication - no insulin    Enlarged prostate    GERD (gastroesophageal reflux disease)    Hypertension    Ventral hernia    Wrist injury    left- unsure how it occurred     Past Surgical History:  Procedure Laterality Date   APPENDECTOMY     ERCP N/A 01/29/2014   Procedure: ENDOSCOPIC RETROGRADE CHOLANGIOPANCREATOGRAPHY (ERCP);  Surgeon: Belvie JONETTA Just, MD;  Location: [facility];  Service: Endoscopy;  Laterality: N/A;   EUS N/A 01/01/2014   Procedure: UPPER ENDOSCOPIC ULTRASOUND (EUS) LINEAR;  Surgeon: Belvie JONETTA Just, MD;  Location: [facility];  Service: Endoscopy;  Laterality: N/A;   HERNIA REPAIR  1980   hiatal hernia   INSERTION OF MESH N/A 09/28/2015   Procedure: INSERTION OF MESH;  Surgeon: Donnice Lima, MD;  Location: [facility];  Service: General;  Laterality: N/A;   LUMBAR LAMINECTOMY/DECOMPRESSION MICRODISCECTOMY N/A 04/24/2013   Procedure: CENTRAL DECOMPRESSIVE LUMBAR LAMINECTOMY L3-4 EXCISION 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 ;  Surgeon: Tanda DELENA Heading, MD;  Location: [facility];  Service: Orthopedics;  Laterality: N/A;   PANCREATIC STENT PLACEMENT N/A 01/29/2014   Procedure: PANCREATIC STENT PLACEMENT;  Surgeon: Belvie JONETTA Just, MD;  Location: [facility];  Service: Endoscopy;  Laterality: N/A;   ROTATOR CUFF REPAIR Right    UMBILICAL HERNIA REPAIR N/A 03/04/2014   Procedure: HERNIA REPAIR UMBILICAL ADULT;  Surgeon: Jina Nephew, MD;  Location: [facility];  Service: General;  Laterality: N/A;   VENTRAL HERNIA REPAIR N/A 09/28/2015   Procedure:  VENTRAL INCISIONAL HERNIA REPAIR;  Surgeon: Donnice Lima, MD;  Location: [facility];  Service: General;  Laterality: N/A;   WHIPPLE PROCEDURE N/A 03/04/2014   Procedure:  DIAGNOSTIC LAPAROSCOPY, PANCREATICODUODENECTOMY, REMOVAL OF BILIARY STENT, PLACEMENT OF PANCREATIC DUCT STENT;  Surgeon: Jina Nephew, MD;  Location: [facility];  Service: General;  Laterality: N/A;    Family Psychiatric History:   Family History:  Family History  Problem Relation Age of Onset   Diabetes Father     Social History:   Social History   Socioeconomic History   Marital status: Married    Spouse name: Not on file   Number of children: Not on file   Years of education: Not on file   Highest education level: Not on file  Occupational History   Not on file  Tobacco Use   Smoking status: Never   Smokeless tobacco: Never  Substance and Sexual  Activity   Alcohol use: No   Drug use: Yes    Comment: marijuana   3 x's /weeks ago- last time- April 2017   Sexual activity: Not on file  Other Topics Concern   Not on file  Social History Narrative   Not on file   Social Drivers of Health   Financial Resource Strain: Low Risk  (07/03/2023)   Received from [facility]   Overall Financial Resource Strain (CARDIA)    Difficulty of Paying Living Expenses: Not hard at all  Food Insecurity: No Food Insecurity (07/03/2023)   Received from [facility]   Hunger Vital Sign    Within the past 12 months, you worried that your food would run out before you got the money to buy more.: Never true    Within the past 12 months, the food you bought just didn't last and you didn't have money to get more.: Never true  Transportation Needs: No Transportation Needs (07/03/2023)   Received from [facility] - Transportation    Lack of Transportation (Medical): No    Lack of Transportation (Non-Medical): No  Physical Activity: Insufficiently Active (03/11/2023)   Received [facility]   Exercise Vital Sign    On average, how many days per week do you engage in moderate to strenuous exercise (like a brisk walk)?: 1 day    On average, how many minutes do you engage in exercise at this level?: 10 min  Stress: No Stress Concern Present (03/11/2023)   Received from [facility] - Occupational Stress Questionnaire    Feeling of Stress : Not at all  Social Connections: Moderately Integrated (03/11/2023)   Received from [facility]   Social Network    How would you rate your social network (family, work, friends)?: Adequate participation with social networks    Additional Social History:   Allergies:   Allergies  Allergen Reactions   Morphine  And Codeine     hallucinations    Aspirin Nausea Only and Other (See Comments)    Pt. Feels the mouth is watery & nausea     Metabolic Disorder  Labs: Lab Results  Component Value Date   HGBA1C 7.0 (H) 09/20/2015   MPG 154 09/20/2015   No results found for: PROLACTIN No results found for: CHOL, TRIG, HDL, CHOLHDL, VLDL, LDLCALC No results found for: TSH  Therapeutic Level Labs: No results found for: LITHIUM No results found for: CBMZ No results found for: VALPROATE  Current Medications: Current Outpatient Medications  Medication Sig Dispense Refill   acetaminophen  (TYLENOL ) 500 MG tablet Take 1,000 mg by mouth daily as needed for mild pain.     CREON  36000 units CPEP capsule Take 72,000 mg by mouth 3 (three) times daily with meals.   11   esomeprazole  (NEXIUM ) 40 MG capsule Take  1 capsule (40 mg total) by mouth daily at 12 noon. 30 capsule 0   feeding supplement, GLUCERNA SHAKE, (GLUCERNA SHAKE) LIQD Take 237 mLs by mouth 3 (three) times daily between meals. 90 Can 0   ferrous fumarate  (HEMOCYTE - 106 MG FE) 325 (106 FE) MG TABS tablet Take 1 tablet by mouth daily.     finasteride  (PROSCAR ) 5 MG tablet Take 5 mg by mouth daily.     glipiZIDE  (GLUCOTROL  XL) 10 MG 24 hr tablet Take 10 mg by mouth daily with breakfast.      metFORMIN  (GLUCOPHAGE -XR) 500 MG 24 hr tablet Take 1,000 mg by mouth 2 (two) times daily.      Multiple Vitamin (MULTIVITAMIN WITH MINERALS) TABS tablet Take 1 tablet by mouth daily.     oxyCODONE -acetaminophen  (PERCOCET/ROXICET) 5-325 MG tablet Take 1-2 tablets by mouth every 4 (four) hours as needed for moderate pain. 30 tablet 0   oxyCODONE -acetaminophen  (PERCOCET/ROXICET) 5-325 MG tablet Take 1 tablet by mouth every 4 (four) hours as needed for severe pain. 40 tablet 0   tolterodine (DETROL) 2 MG tablet Take 2 mg by mouth 2 (two) times daily.   11   clonazePAM  (KLONOPIN ) 0.5 MG tablet 1  qam  3  qhs 120 tablet 4   lisinopril -hydrochlorothiazide  (PRINZIDE ,ZESTORETIC ) 20-12.5 MG per tablet Take 1 tablet by mouth every morning. (Patient not taking: Reported on 11/26/2023)     traZODone   (DESYREL ) 100 MG tablet 2qhs for 1 wek may increse to 3 qhs 90 tablet 5   No current facility-administered medications for this visit.    Musculoskeletal: Strength & Muscle Tone: within normal limits Gait & Station: normal Patient leans: N/A  Psychiatric Specialty Exam: Review of Systems  Blood pressure (!) 149/75, pulse 78, height 6' 1 (1.854 m), weight 162 lb (73.5 kg).Body mass index is 21.37 kg/m.  General Appearance: Casual  Eye Contact:  Good  Speech:  Clear and Coherent  Volume:  Normal  Mood:  Anxious  Affect:  NA  Thought Process:  Coherent  Orientation:  Full (Time, Place, and Person)  Thought Content:  WDL  Suicidal Thoughts:  No  Homicidal Thoughts:  No  Memory:  NA  Judgement:  NA  Insight:  NA  Psychomotor Activity:  Normal  Concentration:    Recall:  Good  Fund of Knowledge:Good  Language: Good  Akathisia:  No  Handed:  Right  AIMS (if indicated):  not done  Assets:  Desire for Improvement  ADL's:  Intact  Cognition: WNL  Sleep:  Good   Screenings: Flowsheet Row ED from [facility]/29/2023 in [facility]  C-SSRS RISK CATEGORY No Risk    Assessment and Plan:   At this time the patient's diagnosis is an adjustment disorder with an anxious mood state in addition to generalized anxiety disorder.  Today we will go to slightly increase his Klonopin  to 0.5 mg 1 in the morning and 3 at night.  A total of 2 mg.  This will be the maximum dose will give this individual.  We will stay at this dose and he will return to see us  in 3 months.  This patient has been under my care for a number of years.  I believe he is very credible.   Elna LILLETTE Lo, MD 7/22/20252:54 PM

## 2024-02-26 ENCOUNTER — Other Ambulatory Visit: Payer: Self-pay

## 2024-02-26 ENCOUNTER — Ambulatory Visit (HOSPITAL_COMMUNITY): Admitting: Psychiatry

## 2024-02-26 ENCOUNTER — Encounter (HOSPITAL_COMMUNITY): Payer: Self-pay | Admitting: Psychiatry

## 2024-02-26 ENCOUNTER — Encounter (HOSPITAL_COMMUNITY): Payer: Self-pay

## 2024-03-10 ENCOUNTER — Ambulatory Visit (HOSPITAL_COMMUNITY): Admitting: Psychiatry

## 2024-03-10 ENCOUNTER — Other Ambulatory Visit: Payer: Self-pay

## 2024-03-10 ENCOUNTER — Encounter (HOSPITAL_COMMUNITY): Payer: Self-pay

## 2024-03-10 ENCOUNTER — Encounter (HOSPITAL_COMMUNITY): Payer: Self-pay | Admitting: Psychiatry

## 2024-04-23 ENCOUNTER — Ambulatory Visit: Admitting: Family Medicine

## 2024-06-09 ENCOUNTER — Other Ambulatory Visit (HOSPITAL_COMMUNITY): Payer: Self-pay | Admitting: Psychiatry
